# Patient Record
Sex: Male | Born: 1965
Health system: Southern US, Community
[De-identification: ages and names within clinical notes are randomized; demographics above are authoritative.]

## PROBLEM LIST (undated history)

## (undated) DIAGNOSIS — G4730 Sleep apnea, unspecified: Secondary | ICD-10-CM

## (undated) HISTORY — DX: Sleep apnea, unspecified: G47.30

## (undated) HISTORY — PX: HERNIA REPAIR: SHX51

## (undated) HISTORY — PX: TONSILECTOMY, ADENOIDECTOMY, BILATERAL MYRINGOTOMY AND TUBES: SHX2538

---

## 2009-07-20 ENCOUNTER — Ambulatory Visit: Payer: Self-pay | Admitting: Internal Medicine

## 2013-03-17 ENCOUNTER — Ambulatory Visit (INDEPENDENT_AMBULATORY_CARE_PROVIDER_SITE_OTHER): Payer: BC Managed Care – PPO | Admitting: Internal Medicine

## 2013-03-17 ENCOUNTER — Encounter: Payer: Self-pay | Admitting: Internal Medicine

## 2013-03-17 VITALS — BP 152/94 | HR 98 | Temp 98.4°F | Ht 72.0 in | Wt 219.0 lb

## 2013-03-17 DIAGNOSIS — I10 Essential (primary) hypertension: Secondary | ICD-10-CM | POA: Insufficient documentation

## 2013-03-17 DIAGNOSIS — R030 Elevated blood-pressure reading, without diagnosis of hypertension: Secondary | ICD-10-CM

## 2013-03-17 DIAGNOSIS — R0609 Other forms of dyspnea: Secondary | ICD-10-CM

## 2013-03-17 DIAGNOSIS — Z0000 Encounter for general adult medical examination without abnormal findings: Secondary | ICD-10-CM

## 2013-03-17 DIAGNOSIS — R0683 Snoring: Secondary | ICD-10-CM

## 2013-03-17 DIAGNOSIS — L301 Dyshidrosis [pompholyx]: Secondary | ICD-10-CM | POA: Insufficient documentation

## 2013-03-17 LAB — TSH: TSH: 0.94 u[IU]/mL (ref 0.35–5.50)

## 2013-03-17 LAB — COMPREHENSIVE METABOLIC PANEL
ALT: 46 U/L (ref 0–53)
AST: 32 U/L (ref 0–37)
Alkaline Phosphatase: 53 U/L (ref 39–117)
Calcium: 9.8 mg/dL (ref 8.4–10.5)
Chloride: 102 mEq/L (ref 96–112)
Creatinine, Ser: 1.1 mg/dL (ref 0.4–1.5)
Total Bilirubin: 0.8 mg/dL (ref 0.3–1.2)

## 2013-03-17 LAB — CBC WITH DIFFERENTIAL/PLATELET
Basophils Absolute: 0 10*3/uL (ref 0.0–0.1)
Eosinophils Relative: 1.7 % (ref 0.0–5.0)
Lymphocytes Relative: 24.3 % (ref 12.0–46.0)
Monocytes Relative: 11 % (ref 3.0–12.0)
Neutrophils Relative %: 62.6 % (ref 43.0–77.0)
Platelets: 227 10*3/uL (ref 150.0–400.0)
RDW: 12.4 % (ref 11.5–14.6)
WBC: 8.1 10*3/uL (ref 4.5–10.5)

## 2013-03-17 LAB — MICROALBUMIN / CREATININE URINE RATIO
Creatinine,U: 203.5 mg/dL
Microalb, Ur: 1.4 mg/dL (ref 0.0–1.9)

## 2013-03-17 LAB — LIPID PANEL
HDL: 42.6 mg/dL (ref >39.00)
Total CHOL/HDL Ratio: 5
VLDL: 33.4 mg/dL (ref 0.0–40.0)

## 2013-03-17 LAB — LDL CHOLESTEROL, DIRECT: Direct LDL: 151.7 mg/dL

## 2013-03-17 NOTE — Assessment & Plan Note (Signed)
BP Readings from Last 3 Encounters:  03/17/13 152/94   BP elevated today and has been elevated at home, per pt report. Will have pt monitor BP 1-2 times per week at home and email with readings. Follow up 4 weeks.

## 2013-03-17 NOTE — Patient Instructions (Signed)
Hand Dermatitis  Hand dermatitis (dyshidrotic eczema) is a skin condition in which small, itchy, raised dots or fluid-filled blisters form over the palms of the hands. Outbreaks of hand dermatitis can last 3 to 4 weeks.  CAUSES   The cause of hand dermatitis is unknown. However, it occurs most often in patients with a history of allergies such as:  · Hay fever.  · Allergic asthma.  · Allergies to latex.  Chemical exposure, injuries, and environmental irritants can make hand dermatitis worse. Washing your hands too frequently can remove natural oils, which can dry out the skin and contribute to outbreaks of hand dermatitis.  SYMPTOMS   The most common symptom of hand dermatitis is intense itching. Cracks or grooves (fissures) on the fingers can also develop. Affected areas can be painful, especially areas where large blisters have formed.  DIAGNOSIS  Your caregiver can usually tell what the problem is by doing a physical exam.  PREVENTION  · Avoid excessive hand washing.  · Avoid the use of harsh chemicals.  · Wear protective gloves when handling products that can irritate your skin.  TREATMENT   Steroid creams and ointments, such as over-the-counter 1% hydrocortisone cream, can reduce inflammation and improve moisture retention. These should be applied at least 2 to 4 times per day. Your caregiver may ask you to use a stronger prescription steroid cream to help speed the healing of blistered and cracked skin. In severe cases, oral steroid medicine may be needed. If you have an infection, antibiotics may be needed. Your caregiver may also prescribe antihistamines. These medicines help reduce itching.  HOME CARE INSTRUCTIONS  · Only take over-the-counter or prescription medicines as directed by your caregiver.  · You may use wet or cold compresses. This can help:  · Alleviate itching.  · Increase the effectiveness of topical creams.  · Minimize blisters.  SEEK MEDICAL CARE IF:  · The rash is not better after 1 week of  treatment.  · Signs of infection develop, such as redness, tenderness, or yellowish-white fluid (pus).  · The rash is spreading.  Document Released: 10/14/2005 Document Revised: 01/06/2012 Document Reviewed: 03/13/2011  [employer]® Patient Information ©2014 [employer].

## 2013-03-17 NOTE — Assessment & Plan Note (Signed)
Exam of dermatitis over hands most consistent with dyshidrotic eczema, however no improvement with avoidance of harsh soaps, use of emollient cream, and use of topical steroids (as prescribed by previous dermatologist). H/o psoriasis over elbows in the past. Question if this may be the cause of dermatitis over hands. Question if he may need immune modulating agents for better control of symptoms. Will set up dermatology evaluation.

## 2013-03-17 NOTE — Assessment & Plan Note (Signed)
Symptoms of snoring and witnessed apnea, concerning for obstructive sleep apnea. Will set up sleep study for evaluation.

## 2013-03-17 NOTE — Progress Notes (Signed)
  Subjective:    Patient ID: Henry Garrett, male    DOB: 23-Jun-1966, 47 y.o.   MRN: [id number]  HPI 47YO male presents to establish care. Notes that his wife has been concerned that he is snoring at night and his breathing pauses on occasion. No known h/o sleep apnea. Occasional daytime fatigue.  Also concerned about several year h/o rash over his hands. Previous physician treated with topical steroids with minimal improvement. Had discussed using biologic agents, but never started. Rash described as painful, red. Worsening with frequent hand washing. No improvement with gentle soaps, moisturizing creams, topical steroids.  Also notes that BP has been elevated over last several months, typically 120-140s/80s. No chest pain, headache, palpitations. No use of supplement meds or excessive caffeine intake.  No outpatient encounter prescriptions on file as of 03/17/2013.   No facility-administered encounter medications on file as of 03/17/2013.   BP 152/94  Pulse 98  Temp(Src) 98.4 F (36.9 C) (Oral)  Ht 6' (1.829 m)  Wt 219 lb (99.338 kg)  BMI 29.7 kg/m2  SpO2 98%  Review of Systems  Constitutional: Negative for fever, chills, activity change, appetite change, fatigue and unexpected weight change.  Eyes: Negative for visual disturbance.  Respiratory: Negative for cough and shortness of breath.   Cardiovascular: Negative for chest pain, palpitations and leg swelling.  Gastrointestinal: Negative for abdominal pain and abdominal distention.  Genitourinary: Negative for dysuria, urgency and difficulty urinating.  Musculoskeletal: Negative for arthralgias and gait problem.  Skin: Positive for rash. Negative for color change.  Hematological: Negative for adenopathy.  Psychiatric/Behavioral: Negative for sleep disturbance and dysphoric mood. The patient is not nervous/anxious.        Objective:   Physical Exam  Constitutional: He is oriented to person, place, and time. He appears  well-developed and well-nourished. No distress.  HENT:  Head: Normocephalic and atraumatic.  Right Ear: External ear normal.  Left Ear: External ear normal.  Nose: Nose normal.  Mouth/Throat: Oropharynx is clear and moist. No oropharyngeal exudate.  Eyes: Conjunctivae and EOM are normal. Pupils are equal, round, and reactive to light. Right eye exhibits no discharge. Left eye exhibits no discharge. No scleral icterus.  Neck: Normal range of motion. Neck supple. No tracheal deviation present. No thyromegaly present.  Cardiovascular: Normal rate, regular rhythm and normal heart sounds.  Exam reveals no gallop and no friction rub.   No murmur heard. Pulmonary/Chest: Effort normal and breath sounds normal. No respiratory distress. He has no wheezes. He has no rales. He exhibits no tenderness.  Abdominal: Soft. Bowel sounds are normal. He exhibits no distension. There is no tenderness.  Musculoskeletal: Normal range of motion. He exhibits no edema.  Lymphadenopathy:    He has no cervical adenopathy.  Neurological: He is alert and oriented to person, place, and time. No cranial nerve deficit. Coordination normal.  Skin: Skin is warm and dry. Rash noted. Rash is maculopapular (scaling rash over hands bilaterally). He is not diaphoretic. There is erythema. No pallor.  Psychiatric: He has a normal mood and affect. His behavior is normal. Judgment and thought content normal.          Assessment & Plan:

## 2013-03-18 ENCOUNTER — Encounter: Payer: Self-pay | Admitting: *Deleted

## 2013-03-18 ENCOUNTER — Telehealth: Payer: Self-pay | Admitting: *Deleted

## 2013-03-18 NOTE — Telephone Encounter (Signed)
Form completed for [facility] and faxed to [phone]. Original copy mailed to patient home address on file.

## 2013-04-12 ENCOUNTER — Telehealth: Payer: Self-pay | Admitting: Internal Medicine

## 2013-04-12 NOTE — Telephone Encounter (Signed)
Sleep Study showed severe obstructive sleep apnea. We need to set up CPAP titration for pt. Can you let pt know?  Amber, Can you help set up CPAP titration?

## 2013-04-13 NOTE — Telephone Encounter (Signed)
Spoke with patient, he stated this has already been done. They suppose to be setting him up with equipment sometime this afternoon

## 2013-04-13 NOTE — Telephone Encounter (Signed)
Left message to call back  

## 2013-04-16 ENCOUNTER — Encounter: Payer: Self-pay | Admitting: Internal Medicine

## 2013-04-22 ENCOUNTER — Ambulatory Visit: Payer: BC Managed Care – PPO | Admitting: Internal Medicine

## 2013-04-23 ENCOUNTER — Encounter: Payer: Self-pay | Admitting: Internal Medicine

## 2013-05-13 ENCOUNTER — Encounter: Payer: Self-pay | Admitting: Internal Medicine

## 2013-05-28 ENCOUNTER — Ambulatory Visit (INDEPENDENT_AMBULATORY_CARE_PROVIDER_SITE_OTHER): Payer: BC Managed Care – PPO | Admitting: Internal Medicine

## 2013-05-28 ENCOUNTER — Encounter: Payer: Self-pay | Admitting: Internal Medicine

## 2013-05-28 VITALS — BP 148/84 | HR 90 | Temp 98.2°F | Wt 225.0 lb

## 2013-05-28 DIAGNOSIS — I10 Essential (primary) hypertension: Secondary | ICD-10-CM

## 2013-05-28 DIAGNOSIS — E669 Obesity, unspecified: Secondary | ICD-10-CM | POA: Insufficient documentation

## 2013-05-28 DIAGNOSIS — G4733 Obstructive sleep apnea (adult) (pediatric): Secondary | ICD-10-CM | POA: Insufficient documentation

## 2013-05-28 NOTE — Patient Instructions (Signed)
Please check blood pressure 1-2 times per week and email with readings.

## 2013-05-28 NOTE — Assessment & Plan Note (Signed)
BP Readings from Last 3 Encounters:  05/28/13 148/84  03/17/13 152/94   BP elevated today however well controlled at home. Will have pt monitor BP closely at home and email with readings. If consistently >140/90, then will start medication. Recent renal function and urine microalbumin was normal.

## 2013-05-28 NOTE — Assessment & Plan Note (Signed)
Sleeping better with use of CPAP. Will continue. Follow up prn.

## 2013-05-28 NOTE — Progress Notes (Signed)
  Subjective:    Patient ID: Henry Garrett, male    DOB: [birth year], 47 y.o.   MRN: [id number]  HPI 47YO male with recent diagnosis of OSA presents for follow up. Sleep study 03/2013 showed OSA. Started on CPAP auto-titrate 12-20cmH2O. Tolerating well. Feels more rested. Less waking at night.   In regards to BP, has been checking at home, typically 130s/80s. No chest pain, palpitations, dyspnea. Has been trying to improve diet and increase physical activity in effort to control weight and BP.  No outpatient encounter prescriptions on file as of 05/28/2013.   No facility-administered encounter medications on file as of 05/28/2013.   BP 148/84  Pulse 90  Temp(Src) 98.2 F (36.8 C) (Oral)  Wt 225 lb (102.059 kg)  BMI 30.51 kg/m2  SpO2 97%  Review of Systems  Constitutional: Negative for fever, chills, activity change, appetite change, fatigue and unexpected weight change.  Eyes: Negative for visual disturbance.  Respiratory: Negative for cough and shortness of breath.   Cardiovascular: Negative for chest pain, palpitations and leg swelling.  Gastrointestinal: Negative for abdominal pain and abdominal distention.  Genitourinary: Negative for dysuria, urgency and difficulty urinating.  Musculoskeletal: Negative for arthralgias and gait problem.  Skin: Negative for color change and rash.  Hematological: Negative for adenopathy.  Psychiatric/Behavioral: Negative for sleep disturbance and dysphoric mood. The patient is not nervous/anxious.        Objective:   Physical Exam  Constitutional: He is oriented to person, place, and time. He appears well-developed and well-nourished. No distress.  HENT:  Head: Normocephalic and atraumatic.  Right Ear: External ear normal.  Left Ear: External ear normal.  Nose: Nose normal.  Mouth/Throat: Oropharynx is clear and moist. No oropharyngeal exudate.  Eyes: Conjunctivae and EOM are normal. Pupils are equal, round, and reactive to light. Right eye  exhibits no discharge. Left eye exhibits no discharge. No scleral icterus.  Neck: Normal range of motion. Neck supple. No tracheal deviation present. No thyromegaly present.  Cardiovascular: Normal rate, regular rhythm and normal heart sounds.  Exam reveals no gallop and no friction rub.   No murmur heard. Pulmonary/Chest: Effort normal and breath sounds normal. No respiratory distress. He has no wheezes. He has no rales. He exhibits no tenderness.  Musculoskeletal: Normal range of motion. He exhibits no edema.  Lymphadenopathy:    He has no cervical adenopathy.  Neurological: He is alert and oriented to person, place, and time. No cranial nerve deficit. Coordination normal.  Skin: Skin is warm and dry. No rash noted. He is not diaphoretic. No erythema. No pallor.  Psychiatric: He has a normal mood and affect. His behavior is normal. Judgment and thought content normal.          Assessment & Plan:

## 2013-05-28 NOTE — Assessment & Plan Note (Signed)
Wt Readings from Last 3 Encounters:  05/28/13 225 lb (102.059 kg)  03/17/13 219 lb (99.338 kg)   Body mass index is 30.51 kg/(m^2). Encouraged healthy diet, low in sodium and processed carbohydrates with caloric restriction and goal of weight loss. Encouraged regular physical activity.

## 2013-05-31 ENCOUNTER — Encounter: Payer: Self-pay | Admitting: Internal Medicine

## 2013-06-14 ENCOUNTER — Encounter: Payer: Self-pay | Admitting: Internal Medicine

## 2013-06-17 ENCOUNTER — Encounter: Payer: Self-pay | Admitting: Internal Medicine

## 2013-06-25 ENCOUNTER — Ambulatory Visit: Payer: BC Managed Care – PPO | Admitting: Internal Medicine

## 2013-09-02 ENCOUNTER — Other Ambulatory Visit: Payer: Self-pay

## 2013-09-02 ENCOUNTER — Ambulatory Visit: Payer: BC Managed Care – PPO | Admitting: Internal Medicine

## 2013-10-01 ENCOUNTER — Ambulatory Visit: Payer: BC Managed Care – PPO | Admitting: Internal Medicine

## 2013-11-02 ENCOUNTER — Ambulatory Visit (INDEPENDENT_AMBULATORY_CARE_PROVIDER_SITE_OTHER): Payer: BC Managed Care – PPO | Admitting: Internal Medicine

## 2013-11-02 ENCOUNTER — Encounter: Payer: Self-pay | Admitting: Internal Medicine

## 2013-11-02 VITALS — BP 158/90 | HR 104 | Temp 98.1°F | Wt 226.0 lb

## 2013-11-02 DIAGNOSIS — I10 Essential (primary) hypertension: Secondary | ICD-10-CM

## 2013-11-02 NOTE — Assessment & Plan Note (Signed)
BP Readings from Last 3 Encounters:  11/02/13 158/90  05/28/13 148/84  03/17/13 152/94   BP elevated again today. Recommended starting medication to help control BP. Pt would likely benefit from a betablocker, given anxiety, tachycardia. Pt declines for now. Would like to work on diet and exercise and follow up in 4 weeks to see if BP continues to be elevated. Encouraged him to buy a BP cuff for home and email periodically with readings.

## 2013-11-02 NOTE — Progress Notes (Signed)
   Subjective:    Patient ID: Henry Garrett, male    DOB: 01-Jul-1966, 48 y.o.   MRN: [id number]  HPI 48 year old male with history of hypertension presents for followup. He has not been checking his blood pressure at home. He denies any chest pain, palpitations, headache. He notes some dietary indiscretion and weight gain over the holidays.  No outpatient encounter prescriptions on file as of 11/02/2013.   BP 158/90  Pulse 104  Temp(Src) 98.1 F (36.7 C) (Oral)  Wt 226 lb (102.513 kg)  SpO2 98%  Review of Systems  Constitutional: Negative for fever, chills, activity change, appetite change, fatigue and unexpected weight change.  Eyes: Negative for visual disturbance.  Respiratory: Negative for cough and shortness of breath.   Cardiovascular: Negative for chest pain, palpitations and leg swelling.  Gastrointestinal: Negative for abdominal pain and abdominal distention.  Genitourinary: Negative for dysuria, urgency and difficulty urinating.  Musculoskeletal: Negative for arthralgias and gait problem.  Skin: Negative for color change and rash.  Hematological: Negative for adenopathy.  Psychiatric/Behavioral: Negative for sleep disturbance and dysphoric mood. The patient is not nervous/anxious.        Objective:   Physical Exam  Constitutional: He is oriented to person, place, and time. He appears well-developed and well-nourished. No distress.  HENT:  Head: Normocephalic and atraumatic.  Right Ear: External ear normal.  Left Ear: External ear normal.  Nose: Nose normal.  Mouth/Throat: Oropharynx is clear and moist. No oropharyngeal exudate.  Eyes: Conjunctivae and EOM are normal. Pupils are equal, round, and reactive to light. Right eye exhibits no discharge. Left eye exhibits no discharge. No scleral icterus.  Neck: Normal range of motion. Neck supple. No tracheal deviation present. No thyromegaly present.  Cardiovascular: Normal rate, regular rhythm and normal heart sounds.   Exam reveals no gallop and no friction rub.   No murmur heard. Pulmonary/Chest: Effort normal and breath sounds normal. No respiratory distress. He has no wheezes. He has no rales. He exhibits no tenderness.  Musculoskeletal: Normal range of motion. He exhibits no edema.  Lymphadenopathy:    He has no cervical adenopathy.  Neurological: He is alert and oriented to person, place, and time. No cranial nerve deficit. Coordination normal.  Skin: Skin is warm and dry. No rash noted. He is not diaphoretic. No erythema. No pallor.  Psychiatric: His behavior is normal. Judgment and thought content normal. His mood appears anxious.          Assessment & Plan:

## 2013-11-02 NOTE — Progress Notes (Signed)
Pre-visit discussion using our clinic review tool. No additional management support is needed unless otherwise documented below in the visit note.  

## 2013-11-30 ENCOUNTER — Telehealth: Payer: Self-pay | Admitting: Internal Medicine

## 2013-11-30 NOTE — Telephone Encounter (Signed)
Relevant patient education assigned to patient using Emmi. ° °

## 2014-01-10 ENCOUNTER — Ambulatory Visit (INDEPENDENT_AMBULATORY_CARE_PROVIDER_SITE_OTHER): Payer: BC Managed Care – PPO | Admitting: Internal Medicine

## 2014-01-10 ENCOUNTER — Encounter: Payer: Self-pay | Admitting: Internal Medicine

## 2014-01-10 VITALS — BP 148/88 | HR 96 | Temp 98.1°F | Resp 16 | Wt 225.0 lb

## 2014-01-10 DIAGNOSIS — I10 Essential (primary) hypertension: Secondary | ICD-10-CM

## 2014-01-10 MED ORDER — METOPROLOL SUCCINATE ER 25 MG PO TB24: 25.0000 mg | ORAL_TABLET | Freq: Every day | ORAL | Status: DC

## 2014-01-10 NOTE — Assessment & Plan Note (Signed)
BP Readings from Last 3 Encounters:  01/10/14 148/88  11/02/13 158/90  05/28/13 148/84   BP persistently elevated. Will start metoprolol 25mg  daily. Pt will monitor BP at home and follow up with readings. Follow up visit in 02/2014.

## 2014-01-10 NOTE — Progress Notes (Signed)
Pre visit review using our clinic review tool, if applicable. No additional management support is needed unless otherwise documented below in the visit note. 

## 2014-01-10 NOTE — Progress Notes (Signed)
   Subjective:    Patient ID: Henry Garrett, male    DOB: 1966-10-13, 48 y.o.   MRN: [id number]  HPI 48YO male with HTN presents for follow up.  HTN - BP has been up and down. Highest readings in 160s/100s. No chest pain, headache.  Review of Systems  Constitutional: Negative for fever, chills, activity change, appetite change, fatigue and unexpected weight change.  Eyes: Negative for visual disturbance.  Respiratory: Negative for cough and shortness of breath.   Cardiovascular: Negative for chest pain, palpitations and leg swelling.  Gastrointestinal: Negative for abdominal pain and abdominal distention.  Genitourinary: Negative for dysuria, urgency and difficulty urinating.  Musculoskeletal: Negative for arthralgias and gait problem.  Skin: Negative for color change and rash.  Hematological: Negative for adenopathy.  Psychiatric/Behavioral: Negative for sleep disturbance and dysphoric mood. The patient is not nervous/anxious.        Objective:    BP 148/88  Pulse 96  Temp(Src) 98.1 F (36.7 C) (Oral)  Resp 16  Wt 225 lb (102.059 kg)  SpO2 96% Physical Exam  Constitutional: He is oriented to person, place, and time. He appears well-developed and well-nourished. No distress.  HENT:  Head: Normocephalic and atraumatic.  Right Ear: External ear normal.  Left Ear: External ear normal.  Nose: Nose normal.  Mouth/Throat: Oropharynx is clear and moist. No oropharyngeal exudate.  Eyes: Conjunctivae and EOM are normal. Pupils are equal, round, and reactive to light. Right eye exhibits no discharge. Left eye exhibits no discharge. No scleral icterus.  Neck: Normal range of motion. Neck supple. No tracheal deviation present. No thyromegaly present.  Cardiovascular: Normal rate, regular rhythm and normal heart sounds.  Exam reveals no gallop and no friction rub.   No murmur heard. Pulmonary/Chest: Effort normal and breath sounds normal. No accessory muscle usage. Not tachypneic.  No respiratory distress. He has no decreased breath sounds. He has no wheezes. He has no rhonchi. He has no rales. He exhibits no tenderness.  Musculoskeletal: Normal range of motion. He exhibits no edema.  Lymphadenopathy:    He has no cervical adenopathy.  Neurological: He is alert and oriented to person, place, and time. No cranial nerve deficit. Coordination normal.  Skin: Skin is warm and dry. No rash noted. He is not diaphoretic. No erythema. No pallor.  Psychiatric: His behavior is normal. Judgment and thought content normal. His mood appears anxious.          Assessment & Plan:   Problem List Items Addressed This Visit   Essential hypertension, benign - Primary      BP Readings from Last 3 Encounters:  01/10/14 148/88  11/02/13 158/90  05/28/13 148/84   BP persistently elevated. Will start metoprolol 25mg  daily. Pt will monitor BP at home and follow up with readings. Follow up visit in 02/2014.    Relevant Medications      metoprolol succinate (TOPROL-XL) 24 hr tablet       Return in about 10 weeks (around 03/21/2014) for Physical.

## 2014-01-11 ENCOUNTER — Telehealth: Payer: Self-pay | Admitting: Internal Medicine

## 2014-01-11 NOTE — Telephone Encounter (Signed)
Relevant patient education assigned to patient using Emmi. ° °

## 2014-01-21 ENCOUNTER — Encounter: Payer: Self-pay | Admitting: Internal Medicine

## 2014-01-27 ENCOUNTER — Other Ambulatory Visit: Payer: Self-pay | Admitting: *Deleted

## 2014-01-27 DIAGNOSIS — I10 Essential (primary) hypertension: Secondary | ICD-10-CM

## 2014-01-27 MED ORDER — METOPROLOL SUCCINATE ER 50 MG PO TB24: 50.0000 mg | ORAL_TABLET | Freq: Every day | ORAL | Status: DC

## 2014-01-27 NOTE — Telephone Encounter (Signed)
Prescription sent to the pharmacy.

## 2014-01-27 NOTE — Telephone Encounter (Signed)
Should just be Metoprolol XL 50mg  daily #30 with 6 refills.

## 2014-01-27 NOTE — Telephone Encounter (Signed)
Patient called and stated he needs a refill on Metoprolol. It was prescribed as once a day then he was instructed to increase it to twice a day. Pharmacy will not refill because it is too soon, need new prescription sent to pharmacy if this is correct.

## 2014-03-15 ENCOUNTER — Encounter: Payer: BC Managed Care – PPO | Admitting: Internal Medicine

## 2014-03-23 ENCOUNTER — Other Ambulatory Visit: Payer: Self-pay | Admitting: *Deleted

## 2014-03-23 MED ORDER — METOPROLOL SUCCINATE ER 50 MG PO TB24: 50.0000 mg | ORAL_TABLET | Freq: Every day | ORAL | Status: DC

## 2014-03-23 NOTE — Telephone Encounter (Signed)
Pt states that Rx must be sent to [facility]

## 2014-04-18 ENCOUNTER — Encounter: Payer: BC Managed Care – PPO | Admitting: Internal Medicine

## 2014-05-03 ENCOUNTER — Ambulatory Visit (INDEPENDENT_AMBULATORY_CARE_PROVIDER_SITE_OTHER): Payer: BC Managed Care – PPO | Admitting: Internal Medicine

## 2014-05-03 ENCOUNTER — Encounter: Payer: Self-pay | Admitting: Internal Medicine

## 2014-05-03 VITALS — BP 140/86 | HR 93 | Temp 98.1°F | Ht 72.5 in | Wt 232.8 lb

## 2014-05-03 DIAGNOSIS — Z0000 Encounter for general adult medical examination without abnormal findings: Secondary | ICD-10-CM

## 2014-05-03 DIAGNOSIS — I10 Essential (primary) hypertension: Secondary | ICD-10-CM

## 2014-05-03 DIAGNOSIS — E669 Obesity, unspecified: Secondary | ICD-10-CM

## 2014-05-03 NOTE — Assessment & Plan Note (Signed)
General medical exam normal today except as noted. Will check labs including CBC, CMP, lipids. Encouraged healthy diet and exercise. Immunizations are UTD except flu which he will get this fall. Follow up 6 months and prn.

## 2014-05-03 NOTE — Progress Notes (Signed)
Pre visit review using our clinic review tool, if applicable. No additional management support is needed unless otherwise documented below in the visit note. 

## 2014-05-03 NOTE — Patient Instructions (Signed)
It was nice to see you today.  You are doing well.  Continue healthy diet and set a goal of exercising 40 minutes three times per week.

## 2014-05-03 NOTE — Progress Notes (Signed)
Subjective:    Patient ID: Henry Garrett, male    DOB: 03/26/1966, 48 y.o.   MRN: [id number]  HPI 48YO male presents for annual exam. Doing well. BP typically 120s/70s with HR near 60s. Compliant with meds. Notes that his hand tremor has improved with use of Metoprolol. Writing has improved.  Not recently exercising. Not following any specific diet.   Review of Systems  Constitutional: Negative for fever, chills, activity change, appetite change, fatigue and unexpected weight change.  Eyes: Negative for visual disturbance.  Respiratory: Negative for cough and shortness of breath.   Cardiovascular: Negative for chest pain, palpitations and leg swelling.  Gastrointestinal: Negative for nausea, vomiting, abdominal pain, diarrhea, constipation and abdominal distention.  Genitourinary: Negative for dysuria, urgency, frequency and difficulty urinating.  Musculoskeletal: Negative for arthralgias and gait problem.  Skin: Negative for color change and rash.  Hematological: Negative for adenopathy.  Psychiatric/Behavioral: Negative for sleep disturbance and dysphoric mood. The patient is not nervous/anxious.        Objective:    BP 140/86  Pulse 93  Temp(Src) 98.1 F (36.7 C) (Oral)  Ht 6' 0.5" (1.842 m)  Wt 232 lb 12 oz (105.575 kg)  BMI 31.12 kg/m2  SpO2 98%  Waist circum 41in. Physical Exam  Constitutional: He is oriented to person, place, and time. He appears well-developed and well-nourished. No distress.  HENT:  Head: Normocephalic and atraumatic.  Right Ear: External ear normal.  Left Ear: External ear normal.  Nose: Nose normal.  Mouth/Throat: Oropharynx is clear and moist. No oropharyngeal exudate.  Eyes: Conjunctivae and EOM are normal. Pupils are equal, round, and reactive to light. Right eye exhibits no discharge. Left eye exhibits no discharge. No scleral icterus.  Neck: Normal range of motion. Neck supple. No tracheal deviation present. No thyromegaly present.   Cardiovascular: Normal rate, regular rhythm and normal heart sounds.  Exam reveals no gallop and no friction rub.   No murmur heard. Pulmonary/Chest: Effort normal and breath sounds normal. No respiratory distress. He has no wheezes. He has no rales. He exhibits no tenderness.  Abdominal: Soft. Bowel sounds are normal. He exhibits no distension and no mass. There is no tenderness. There is no rebound and no guarding.  Musculoskeletal: Normal range of motion. He exhibits no edema.  Lymphadenopathy:    He has no cervical adenopathy.  Neurological: He is alert and oriented to person, place, and time. He displays tremor (bilateral fine hand tremor). No cranial nerve deficit. Coordination and gait normal.  Skin: Skin is warm and dry. No rash noted. He is not diaphoretic. No erythema. No pallor.  Psychiatric: His behavior is normal. Judgment and thought content normal. His mood appears anxious.          Assessment & Plan:   Problem List Items Addressed This Visit     Unprioritized   Essential hypertension, benign      BP Readings from Last 3 Encounters:  05/03/14 140/86  01/10/14 148/88  11/02/13 158/90   BP improved on Metoprolol. Will check renal function with labs.    Obesity (BMI 30-39.9)      Wt Readings from Last 3 Encounters:  05/03/14 232 lb 12 oz (105.575 kg)  01/10/14 225 lb (102.059 kg)  11/02/13 226 lb (102.513 kg)   Body mass index is 31.12 kg/(m^2). Encouraged healthy, Mediterranean style diet and regular exercise with goal of 7min 3x per week.    Routine general medical examination at a health care facility - Primary  General medical exam normal today except as noted. Will check labs including CBC, CMP, lipids. Encouraged healthy diet and exercise. Immunizations are UTD except flu which he will get this fall. Follow up 6 months and prn.    Relevant Orders      CBC with Differential      Comprehensive metabolic panel      Lipid panel      Microalbumin /  creatinine urine ratio      Vit D  25 hydroxy (rtn osteoporosis monitoring)       Return in about 6 months (around 11/03/2014) for Recheck of Blood Pressure.

## 2014-05-03 NOTE — Assessment & Plan Note (Signed)
BP Readings from Last 3 Encounters:  05/03/14 140/86  01/10/14 148/88  11/02/13 158/90   BP improved on Metoprolol. Will check renal function with labs.

## 2014-05-03 NOTE — Assessment & Plan Note (Signed)
Wt Readings from Last 3 Encounters:  05/03/14 232 lb 12 oz (105.575 kg)  01/10/14 225 lb (102.059 kg)  11/02/13 226 lb (102.513 kg)   Body mass index is 31.12 kg/(m^2). Encouraged healthy, Mediterranean style diet and regular exercise with goal of 22min 3x per week.

## 2014-05-04 LAB — COMPREHENSIVE METABOLIC PANEL
ALBUMIN: 4.4 g/dL (ref 3.5–5.2)
ALK PHOS: 52 U/L (ref 39–117)
ALT: 44 U/L (ref 0–53)
AST: 39 U/L — ABNORMAL HIGH (ref 0–37)
BUN: 11 mg/dL (ref 6–23)
CO2: 28 mEq/L (ref 19–32)
Calcium: 9.3 mg/dL (ref 8.4–10.5)
Chloride: 101 mEq/L (ref 96–112)
Creatinine, Ser: 1 mg/dL (ref 0.4–1.5)
GFR: 82.42 mL/min (ref >60.00)
Glucose, Bld: 99 mg/dL (ref 70–99)
Potassium: 4.1 mEq/L (ref 3.5–5.1)
Sodium: 137 mEq/L (ref 135–145)
Total Bilirubin: 0.8 mg/dL (ref 0.2–1.2)
Total Protein: 7.6 g/dL (ref 6.0–8.3)

## 2014-05-04 LAB — CBC WITH DIFFERENTIAL/PLATELET
BASOS PCT: 0.4 % (ref 0.0–3.0)
Basophils Absolute: 0 10*3/uL (ref 0.0–0.1)
EOS ABS: 0.2 10*3/uL (ref 0.0–0.7)
Eosinophils Relative: 1.9 % (ref 0.0–5.0)
HCT: 46.6 % (ref 39.0–52.0)
Hemoglobin: 15.7 g/dL (ref 13.0–17.0)
LYMPHS PCT: 22.7 % (ref 12.0–46.0)
Lymphs Abs: 1.9 10*3/uL (ref 0.7–4.0)
MCHC: 33.7 g/dL (ref 30.0–36.0)
MCV: 93.1 fl (ref 78.0–100.0)
MONO ABS: 0.8 10*3/uL (ref 0.1–1.0)
Monocytes Relative: 9.8 % (ref 3.0–12.0)
Neutro Abs: 5.6 10*3/uL (ref 1.4–7.7)
Neutrophils Relative %: 65.2 % (ref 43.0–77.0)
PLATELETS: 164 10*3/uL (ref 150.0–400.0)
RBC: 5.01 Mil/uL (ref 4.22–5.81)
RDW: 12.9 % (ref 11.5–15.5)
WBC: 8.5 10*3/uL (ref 4.0–10.5)

## 2014-05-04 LAB — VITAMIN D 25 HYDROXY (VIT D DEFICIENCY, FRACTURES): VITD: 42.18 ng/mL

## 2014-05-04 LAB — MICROALBUMIN / CREATININE URINE RATIO
Creatinine,U: 165.2 mg/dL
Microalb Creat Ratio: 0.2 mg/g (ref 0.0–30.0)
Microalb, Ur: 0.3 mg/dL (ref 0.0–1.9)

## 2014-05-04 LAB — LIPID PANEL
Cholesterol: 244 mg/dL — ABNORMAL HIGH (ref 0–200)
HDL: 49 mg/dL (ref >39.00)
LDL CALC: 171 mg/dL — AB (ref 0–99)
NONHDL: 195
TRIGLYCERIDES: 121 mg/dL (ref 0.0–149.0)
Total CHOL/HDL Ratio: 5
VLDL: 24.2 mg/dL (ref 0.0–40.0)

## 2014-05-17 ENCOUNTER — Encounter: Payer: Self-pay | Admitting: Internal Medicine

## 2014-11-11 ENCOUNTER — Encounter: Payer: Self-pay | Admitting: Internal Medicine

## 2014-11-11 ENCOUNTER — Ambulatory Visit (INDEPENDENT_AMBULATORY_CARE_PROVIDER_SITE_OTHER): Payer: BLUE CROSS/BLUE SHIELD | Admitting: Internal Medicine

## 2014-11-11 VITALS — BP 152/81 | HR 93 | Temp 97.7°F | Ht 72.5 in | Wt 242.8 lb

## 2014-11-11 DIAGNOSIS — R200 Anesthesia of skin: Secondary | ICD-10-CM

## 2014-11-11 DIAGNOSIS — R635 Abnormal weight gain: Secondary | ICD-10-CM

## 2014-11-11 DIAGNOSIS — Z23 Encounter for immunization: Secondary | ICD-10-CM

## 2014-11-11 DIAGNOSIS — I10 Essential (primary) hypertension: Secondary | ICD-10-CM

## 2014-11-11 DIAGNOSIS — R6889 Other general symptoms and signs: Secondary | ICD-10-CM | POA: Insufficient documentation

## 2014-11-11 DIAGNOSIS — R208 Other disturbances of skin sensation: Secondary | ICD-10-CM

## 2014-11-11 LAB — HEMOGLOBIN A1C: HEMOGLOBIN A1C: 6 % (ref 4.6–6.5)

## 2014-11-11 LAB — COMPREHENSIVE METABOLIC PANEL
ALT: 46 U/L (ref 0–53)
AST: 30 U/L (ref 0–37)
Albumin: 4.2 g/dL (ref 3.5–5.2)
Alkaline Phosphatase: 63 U/L (ref 39–117)
BUN: 11 mg/dL (ref 6–23)
CALCIUM: 9.6 mg/dL (ref 8.4–10.5)
CO2: 25 mEq/L (ref 19–32)
CREATININE: 1.03 mg/dL (ref 0.40–1.50)
Chloride: 104 mEq/L (ref 96–112)
GFR: 82.24 mL/min (ref >60.00)
GLUCOSE: 125 mg/dL — AB (ref 70–99)
POTASSIUM: 4.9 meq/L (ref 3.5–5.1)
Sodium: 138 mEq/L (ref 135–145)
TOTAL PROTEIN: 7.5 g/dL (ref 6.0–8.3)
Total Bilirubin: 0.4 mg/dL (ref 0.2–1.2)

## 2014-11-11 LAB — CBC WITH DIFFERENTIAL/PLATELET
BASOS PCT: 0.5 % (ref 0.0–3.0)
Basophils Absolute: 0 10*3/uL (ref 0.0–0.1)
EOS PCT: 3.5 % (ref 0.0–5.0)
Eosinophils Absolute: 0.2 10*3/uL (ref 0.0–0.7)
HEMATOCRIT: 45.9 % (ref 39.0–52.0)
HEMOGLOBIN: 15.5 g/dL (ref 13.0–17.0)
Lymphocytes Relative: 26.6 % (ref 12.0–46.0)
Lymphs Abs: 1.8 10*3/uL (ref 0.7–4.0)
MCHC: 33.9 g/dL (ref 30.0–36.0)
MCV: 92.5 fl (ref 78.0–100.0)
MONO ABS: 0.8 10*3/uL (ref 0.1–1.0)
Monocytes Relative: 11.8 % (ref 3.0–12.0)
NEUTROS PCT: 57.6 % (ref 43.0–77.0)
Neutro Abs: 4 10*3/uL (ref 1.4–7.7)
Platelets: 207 10*3/uL (ref 150.0–400.0)
RBC: 4.96 Mil/uL (ref 4.22–5.81)
RDW: 12.6 % (ref 11.5–15.5)
WBC: 6.9 10*3/uL (ref 4.0–10.5)

## 2014-11-11 LAB — TSH: TSH: 1.31 u[IU]/mL (ref 0.35–4.50)

## 2014-11-11 MED ORDER — CARVEDILOL 6.25 MG PO TABS: 6.2500 mg | ORAL_TABLET | Freq: Two times a day (BID) | ORAL | Status: DC

## 2014-11-11 NOTE — Assessment & Plan Note (Signed)
Wt Readings from Last 3 Encounters:  11/11/14 242 lb 12 oz (110.111 kg)  05/03/14 232 lb 12 oz (105.575 kg)  01/10/14 225 lb (102.059 kg)   Body mass index is 32.45 kg/(m^2). Encouraged healthy diet and exercise. Will check thyroid function with labs.

## 2014-11-11 NOTE — Progress Notes (Signed)
Pre visit review using our clinic review tool, if applicable. No additional management support is needed unless otherwise documented below in the visit note. 

## 2014-11-11 NOTE — Assessment & Plan Note (Signed)
BP Readings from Last 3 Encounters:  11/11/14 152/81  05/03/14 140/86  01/10/14 148/88   BP elevated today. Pt not feeling well on Metoprolol. Will try change to Carvedilol. Email follow up in 2 weeks and visit in 4 weeks.

## 2014-11-11 NOTE — Assessment & Plan Note (Signed)
Temp intolerance and weight gain. Will check thyroid function with labs.

## 2014-11-11 NOTE — Progress Notes (Signed)
Subjective:    Patient ID: Henry Garrett, male    DOB: 07-Sep-1966, 49 y.o.   MRN: [id number]  HPI 49YO male presents for follow up.  HTN - Compliant with Metoprolol. BP typically 140/80s. HR in 60s. Concerned about weight gain on the medication.  Notices more tremor recently in bilateral hands. Notes some intolerance to hot and cold temps. No constipation.  Also concerned about numbness left lateral thigh. No weakness noted. No back pain.Worse when driving long distance. Improves with movement and stretching. Not taking anything for this.  Wt Readings from Last 3 Encounters:  11/11/14 242 lb 12 oz (110.111 kg)  05/03/14 232 lb 12 oz (105.575 kg)  01/10/14 225 lb (102.059 kg)     Past medical, surgical, family and social history per today's encounter.  Review of Systems  Constitutional: Negative for fever, chills, activity change, appetite change, fatigue and unexpected weight change.  Eyes: Negative for visual disturbance.  Respiratory: Negative for cough and shortness of breath.   Cardiovascular: Negative for chest pain, palpitations and leg swelling.  Gastrointestinal: Negative for nausea, vomiting, abdominal pain, diarrhea, constipation and abdominal distention.  Endocrine: Positive for heat intolerance.  Genitourinary: Negative for dysuria, urgency and difficulty urinating.  Musculoskeletal: Positive for myalgias. Negative for arthralgias and gait problem.  Skin: Negative for color change and rash.  Neurological: Positive for tremors and numbness. Negative for weakness.  Hematological: Negative for adenopathy.  Psychiatric/Behavioral: Negative for sleep disturbance and dysphoric mood. The patient is not nervous/anxious.        Objective:    BP 152/81 mmHg  Pulse 93  Temp(Src) 97.7 F (36.5 C) (Oral)  Ht 6' 0.5" (1.842 m)  Wt 242 lb 12 oz (110.111 kg)  BMI 32.45 kg/m2  SpO2 99% Physical Exam  Constitutional: He is oriented to person, place, and time. He  appears well-developed and well-nourished. No distress.  HENT:  Head: Normocephalic and atraumatic.  Right Ear: External ear normal.  Left Ear: External ear normal.  Nose: Nose normal.  Mouth/Throat: Oropharynx is clear and moist. No oropharyngeal exudate.  Eyes: Conjunctivae and EOM are normal. Pupils are equal, round, and reactive to light. Right eye exhibits no discharge. Left eye exhibits no discharge. No scleral icterus.  Neck: Normal range of motion. Neck supple. No tracheal deviation present. No thyromegaly present.  Cardiovascular: Normal rate, regular rhythm and normal heart sounds.  Exam reveals no gallop and no friction rub.   No murmur heard. Pulmonary/Chest: Effort normal and breath sounds normal. No accessory muscle usage. No tachypnea. No respiratory distress. He has no decreased breath sounds. He has no wheezes. He has no rhonchi. He has no rales. He exhibits no tenderness.  Musculoskeletal: Normal range of motion. He exhibits no edema.       Legs: Lymphadenopathy:    He has no cervical adenopathy.  Neurological: He is alert and oriented to person, place, and time. No cranial nerve deficit. Coordination normal.  Skin: Skin is warm and dry. No rash noted. He is not diaphoretic. No erythema. No pallor.  Psychiatric: His behavior is normal. Judgment and thought content normal. His mood appears anxious. Cognition and memory are normal.          Assessment & Plan:   Problem List Items Addressed This Visit      Unprioritized   Essential hypertension, benign - Primary    BP Readings from Last 3 Encounters:  11/11/14 152/81  05/03/14 140/86  01/10/14 148/88   BP elevated today. Pt  not feeling well on Metoprolol. Will try change to Carvedilol. Email follow up in 2 weeks and visit in 4 weeks.      Relevant Medications   carvedilol (COREG) tablet   Other Relevant Orders   Comprehensive metabolic panel   Numbness in left leg    Numbness and pain in lateral left leg.  Symptoms and exam most consistent with IT band syndrome. Gave exercises to help with this. If no improvement, then discussed evaluation with sports medicine and/or neurology for EMG testing to evaluate area of intermittent numbness.      Temperature intolerance    Temp intolerance and weight gain. Will check thyroid function with labs.      Relevant Orders   TSH   CBC with Differential   Weight gain    Wt Readings from Last 3 Encounters:  11/11/14 242 lb 12 oz (110.111 kg)  05/03/14 232 lb 12 oz (105.575 kg)  01/10/14 225 lb (102.059 kg)   Body mass index is 32.45 kg/(m^2). Encouraged healthy diet and exercise. Will check thyroid function with labs.      Relevant Orders   Hemoglobin A1c    Other Visit Diagnoses    Encounter for immunization            Return in about 4 weeks (around 12/09/2014) for Recheck.

## 2014-11-11 NOTE — Assessment & Plan Note (Signed)
Numbness and pain in lateral left leg. Symptoms and exam most consistent with IT band syndrome. Gave exercises to help with this. If no improvement, then discussed evaluation with sports medicine and/or neurology for EMG testing to evaluate area of intermittent numbness.

## 2014-11-11 NOTE — Patient Instructions (Addendum)
Labs today.  Start Carvedilol 6.25mg  twice daily for blood pressure.  Start stretching exercises. Email with update in 1-2 weeks.  Iliotibial Band Syndrome with Rehab The iliotibial (IT) band is a tendon that connects the hip muscles to the shinbone (tibia) and to one of the bones of the pelvis (ileum). The IT band passes by the knee and is often irritated by the outer portion of the knee (lateral femoral condyle). A fluid filled sac (bursa) exists between the tendon and the bone, to cushion and reduce friction. Overuse of the tendon may cause excessive friction, which results in IT band syndrome. This condition involves inflammation of the bursa (bursitis) and/or inflammation of the IT band (tendinitis). SYMPTOMS   Pain, tenderness, swelling, warmth, or redness over the IT band, at the outer knee (above the joint).  Pain that travels up or down the thigh or leg.  Initially, pain at the beginning of an exercise, that decreases once warmed up. Eventually, pain throughout the activity, getting worse as the activity continues. May cause the athlete to stop in the middle of training or competing.  Pain that gets worse when running down hills or stairs, on banked tracks, or next to the curb on the street.  Pain that increases when the foot of the affected leg hits the ground.  Possibly, a crackling sound (crepitation) when the tendon or bursa is moved or touched. CAUSES  IT band syndrome is caused by irritation of the IT band and the underlying bursa. This eventually results in inflammation and pain. IT band syndrome is an overuse injury.  RISK INCREASES WITH:  Sports with repetitive knee-bending activities (distance running, cycling).  Incorrect training techniques, including sudden changes in the intensity, frequency, or duration of training.  Not enough rest between workouts.  Poor strength and flexibility, especially a tight IT band.  Failure to warm up properly before  activity.  Bow legs.  Arthritis of the knee. PREVENTION   Warm up and stretch properly before activity.  Allow for adequate recovery between workouts.  Maintain physical fitness:  Strength, flexibility, and endurance.  Cardiovascular fitness.  Learn and use proper training technique, including reducing running mileage, shortening stride, and avoiding running on hills and banked surfaces.  Wear arch supports (orthotics), if you have flat feet. PROGNOSIS  If treated properly, IT band syndrome usually goes away within 6 weeks of treatment. RELATED COMPLICATIONS   Longer healing time, if not properly treated, or if not given enough time to heal.  Recurring inflammation of the tendon and bursa, that may result in a chronic condition.  Recurring symptoms, if activity is resumed too soon, with overuse, with a direct blow, or with poor training technique.  Inability to complete training or competition. TREATMENT  Treatment first involves the use of ice and medicine, to reduce pain and inflammation. The use of strengthening and stretching exercises may help reduce pain with activity. These exercises may be performed at home or with a therapist. For individuals with flat feet, an arch support (orthotic) may be helpful. Some individuals find that wearing a knee sleeve or compression bandage around the knee during workouts provides some relief. Certain training techniques, such as adjusting stride length, avoiding running on hills or stairs, changing the direction you run on a circular or banked track, or changing the side of the road you run on, if you run next to the curb, may help decrease symptoms of IT band syndrome. Cyclists may need to change the seat height or foot  position on their bicycles. An injection of cortisone into the bursa may be recommended. Surgery to remove the inflamed bursa and/or part of the IT band is only considered after at least 6 months of non-surgical treatment.   MEDICATION   If pain medicine is needed, nonsteroidal anti-inflammatory medicines (aspirin and ibuprofen), or other minor pain relievers (acetaminophen), are often advised.  Do not take pain medicine for 7 days before surgery.  Prescription pain relievers may be given, if your caregiver thinks they are needed. Use only as directed and only as much as you need.  Corticosteroid injections may be given by your caregiver. These injections should be reserved for the most serious cases, because they may only be given a certain number of times. HEAT AND COLD  Cold treatment (icing) should be applied for 10 to 15 minutes every 2 to 3 hours for inflammation and pain, and immediately after activity that aggravates your symptoms. Use ice packs or an ice massage.  Heat treatment may be used before performing stretching and strengthening activities prescribed by your caregiver, physical therapist, or athletic trainer. Use a heat pack or a warm water soak. SEEK MEDICAL CARE IF:   Symptoms get worse or do not improve in 2 to 4 weeks, despite treatment.  New, unexplained symptoms develop. (Drugs used in treatment may produce side effects.) EXERCISES  RANGE OF MOTION (ROM) AND STRETCHING EXERCISES - Iliotibial Band Syndrome These exercises may help you when beginning to rehabilitate your injury. Your symptoms may go away with or without further involvement from your physician, physical therapist or athletic trainer. While completing these exercises, remember:   Restoring tissue flexibility helps normal motion to return to the joints. This allows healthier, less painful movement and activity.  An effective stretch should be held for at least 30 seconds.  A stretch should never be painful. You should only feel a gentle lengthening or release in the stretched tissue. STRETCH - Quadriceps, Prone   Lie on your stomach on a firm surface, such as a bed or padded floor.  Bend your right / left knee and  grasp your ankle. If you are unable to reach your ankle or pant leg, use a belt around your foot to lengthen your reach.  Gently pull your heel toward your buttocks. Your knee should not slide out to the side. You should feel a stretch in the front of your thigh and knee.  Hold this position for __________ seconds. Repeat __________ times. Complete this stretch __________ times per day.  STRETCH - Iliotibial Band  On the floor or bed, lie on your side, so your right / left leg is on top. Bend your knee and grab your ankle.  Slowly bring your knee back so that your thigh is in line with your trunk. Keep your heel at your buttocks and gently arch your back, so your head, shoulders and hips line up.  Slowly lower your leg so that your knee approaches the floor or bed, until you feel a gentle stretch on the outside of your right / left thigh. If you do not feel a stretch and your knee will not fall farther, place the heel of your opposite foot on top of your knee, and pull your thigh down farther.  Hold this stretch for __________ seconds. Repeat __________ times. Complete this stretch __________ times per day. STRENGTHENING EXERCISES - Iliotibial Band Syndrome Improving the flexibility of the IT band will best relieve your discomfort due to IT band syndrome. Strengthening exercises,  however, can help improve both muscle endurance and joint mechanics, reducing the factors that can contribute to this condition. Your physician, physical therapist or athletic trainer may provide you with exercises that train specific muscle groups that are especially weak. The following exercises target muscles that are often weak in people who have IT band syndrome. STRENGTH - Hip Abductors, Straight Leg Raises  Be aware of your form throughout the entire exercise, so that you exercise the correct muscles. Poor form means that you are not strengthening the correct muscles.  Lie on your side, so that your head,  shoulders, knee and hip line up. You may bend your lower knee to help maintain your balance. Your right / left leg should be on top.  Roll your hips slightly forward, so that your hips are stacked directly over each other and your right / left knee is facing forward.  Lift your top leg up 4-6 inches, leading with your heel. Be sure that your foot does not drift forward and that your knee does not roll toward the ceiling.  Hold this position for __________ seconds. You should feel the muscles in your outer hip lifting (you may not notice this until your leg begins to tire).  Slowly lower your leg to the starting position. Allow the muscles to fully relax before beginning the next repetition. Repeat __________ times. Complete this exercise __________ times per day.  STRENGTH - Quad/VMO, Isometric  Sit in a chair with your right / left knee slightly bent. With your fingertips, feel the VMO muscle (just above the inside of your knee). The VMO is important in controlling the position of your kneecap.  Keeping your fingertips on this muscle. Without actually moving your leg, attempt to drive your knee down, as if straightening your leg. You should feel your VMO tense. If you have a difficult time, you may wish to try the same exercise on your healthy knee first.  Tense this muscle as hard as you can, without increasing any knee pain.  Hold for __________ seconds. Relax the muscles slowly and completely between each repetition. Repeat __________ times. Complete this exercise __________ times per day.  Document Released: 10/14/2005 Document Revised: 01/06/2012 Document Reviewed: 01/26/2009 [facility] [address] [address] Patient Information 2015 [place], [place]. This information is not intended to replace advice given to you by your health care provider. Make sure you discuss any questions you have with your health care provider.

## 2014-12-23 ENCOUNTER — Ambulatory Visit: Payer: BLUE CROSS/BLUE SHIELD | Admitting: Internal Medicine

## 2015-02-06 ENCOUNTER — Telehealth: Payer: Self-pay | Admitting: Internal Medicine

## 2015-02-06 NOTE — Telephone Encounter (Signed)
Pt left message that he needs a consult with Dr. Gilford Rile. Pt was seen at the dentist and will need to have an extraction on Wednesday but the dentist is concerned with pt having elevated BP. Please advise pt/msn

## 2015-02-07 ENCOUNTER — Encounter: Payer: Self-pay | Admitting: Internal Medicine

## 2015-02-07 ENCOUNTER — Ambulatory Visit (INDEPENDENT_AMBULATORY_CARE_PROVIDER_SITE_OTHER): Payer: BLUE CROSS/BLUE SHIELD | Admitting: Internal Medicine

## 2015-02-07 VITALS — BP 159/100 | HR 107 | Temp 98.8°F | Ht 72.5 in | Wt 243.0 lb

## 2015-02-07 DIAGNOSIS — I10 Essential (primary) hypertension: Secondary | ICD-10-CM

## 2015-02-07 DIAGNOSIS — K047 Periapical abscess without sinus: Secondary | ICD-10-CM | POA: Diagnosis not present

## 2015-02-07 MED ORDER — AMLODIPINE BESYLATE 5 MG PO TABS: 5.0000 mg | ORAL_TABLET | Freq: Every day | ORAL | Status: DC

## 2015-02-07 NOTE — Telephone Encounter (Signed)
Please add pt to the schedule, Ive already spoke with him

## 2015-02-07 NOTE — Assessment & Plan Note (Signed)
Started on Clindamycin and prn Hydrocodone for severe pain. Scheduled for tooth extraction tomorrow. Will follow.

## 2015-02-07 NOTE — Assessment & Plan Note (Signed)
BP Readings from Last 3 Encounters:  02/07/15 159/100  11/11/14 152/81  05/03/14 140/86   BP elevated. Unable to tolerate betablockers. Will start Amlodipine 5mg  daily He will email with BP readings and follow up in 4 weeks and prn.

## 2015-02-07 NOTE — Telephone Encounter (Signed)
Please see below.

## 2015-02-07 NOTE — Telephone Encounter (Signed)
We can add this afternoon, 3:30?

## 2015-02-07 NOTE — Progress Notes (Signed)
Subjective:    Patient ID: Henry Garrett, male    DOB: May 07, 1966, 49 y.o.   MRN: [id number]  HPI  49YO male presents for acute visit.  HTN - Stopped Carvedilol several weeks ago. Medication made him feel sluggish. BP had been near 140/90 at home until this week. Developed dental abscess. Scheduled for extraction of tooth but needs to get BP down.  Past medical, surgical, family and social history per today's encounter.  Review of Systems  Constitutional: Negative for fever, chills, activity change, appetite change, fatigue and unexpected weight change.  HENT: Positive for dental problem and facial swelling. Negative for trouble swallowing.   Eyes: Negative for visual disturbance.  Respiratory: Negative for cough and shortness of breath.   Cardiovascular: Negative for chest pain, palpitations and leg swelling.  Gastrointestinal: Negative for abdominal pain and abdominal distention.  Genitourinary: Negative for dysuria, urgency and difficulty urinating.  Musculoskeletal: Negative for arthralgias and gait problem.  Skin: Positive for color change. Negative for rash.  Neurological: Negative for headaches.  Hematological: Negative for adenopathy.  Psychiatric/Behavioral: Negative for sleep disturbance and dysphoric mood. The patient is not nervous/anxious.        Objective:    BP 159/100 mmHg  Pulse 107  Temp(Src) 98.8 F (37.1 C) (Oral)  Ht 6' 0.5" (1.842 m)  Wt 243 lb (110.224 kg)  BMI 32.49 kg/m2  SpO2 100% Physical Exam  Constitutional: He is oriented to person, place, and time. He appears well-developed and well-nourished. No distress.  HENT:  Head: Normocephalic and atraumatic.    Right Ear: External ear normal.  Left Ear: External ear normal.  Nose: Nose normal.  Mouth/Throat: Oropharynx is clear and moist.  Eyes: Conjunctivae and EOM are normal. Pupils are equal, round, and reactive to light. Right eye exhibits no discharge. Left eye exhibits no discharge.  No scleral icterus.  Neck: Normal range of motion. Neck supple. No tracheal deviation present. No thyromegaly present.  Cardiovascular: Normal rate, regular rhythm and normal heart sounds.  Exam reveals no gallop and no friction rub.   No murmur heard. Pulmonary/Chest: Effort normal and breath sounds normal. No accessory muscle usage. No tachypnea. No respiratory distress. He has no decreased breath sounds. He has no wheezes. He has no rhonchi. He has no rales. He exhibits no tenderness.  Musculoskeletal: Normal range of motion. He exhibits no edema.  Lymphadenopathy:    He has no cervical adenopathy.  Neurological: He is alert and oriented to person, place, and time. No cranial nerve deficit. Coordination normal.  Skin: Skin is warm and dry. No rash noted. He is not diaphoretic. No erythema. No pallor.  Psychiatric: He has a normal mood and affect. His behavior is normal. Judgment and thought content normal.          Assessment & Plan:   Problem List Items Addressed This Visit      Unprioritized   Dental abscess    Started on Clindamycin and prn Hydrocodone for severe pain. Scheduled for tooth extraction tomorrow. Will follow.      Essential hypertension, benign - Primary    BP Readings from Last 3 Encounters:  02/07/15 159/100  11/11/14 152/81  05/03/14 140/86   BP elevated. Unable to tolerate betablockers. Will start Amlodipine 5mg  daily He will email with BP readings and follow up in 4 weeks and prn.      Relevant Medications   amLODIpine (NORVASC) tablet       Return in about 4 weeks (around 03/07/2015) for  Recheck of Blood Pressure.

## 2015-02-07 NOTE — Progress Notes (Signed)
Pre visit review using our clinic review tool, if applicable. No additional management support is needed unless otherwise documented below in the visit note. 

## 2015-02-07 NOTE — Patient Instructions (Addendum)
Start Amlodipine 5mg  daily. Take first dose tonight and second dose tomorrow morning.  Email with any questions or concerns.  Follow up 4 weeks.

## 2015-03-07 ENCOUNTER — Ambulatory Visit (INDEPENDENT_AMBULATORY_CARE_PROVIDER_SITE_OTHER): Payer: BLUE CROSS/BLUE SHIELD | Admitting: *Deleted

## 2015-03-07 VITALS — BP 140/80 | HR 97

## 2015-03-07 DIAGNOSIS — I10 Essential (primary) hypertension: Secondary | ICD-10-CM | POA: Diagnosis not present

## 2015-03-07 MED ORDER — AMLODIPINE BESYLATE 10 MG PO TABS: 10.0000 mg | ORAL_TABLET | Freq: Every day | ORAL | Status: DC

## 2015-03-07 NOTE — Progress Notes (Signed)
Please ask pt to increase Amlodipine to 10mg  daily. Recheck BP in office in 1-2 weeks.

## 2015-03-07 NOTE — Progress Notes (Signed)
Pt presents for BP check. Doing well without complaints. Taking Amlodipine 5 mg daily without difficulty. See above BP and HR. Advised to continue current therapy and we would call with any new instructions from Dr. Gilford Rile. States BPs at home 130s/80s.

## 2015-03-07 NOTE — Progress Notes (Signed)
Pt notified and  verbalized understanding. New Rx sent to pharmacy for when refill needed. Pt will call back to schedule nurse visit for BP check.

## 2015-03-07 NOTE — Addendum Note (Signed)
Addended by: Wynonia Lawman E on: 03/07/2015 08:52 AM   Modules accepted: Orders

## 2015-03-17 ENCOUNTER — Encounter: Payer: Self-pay | Admitting: Internal Medicine

## 2015-06-02 ENCOUNTER — Ambulatory Visit (INDEPENDENT_AMBULATORY_CARE_PROVIDER_SITE_OTHER): Payer: BLUE CROSS/BLUE SHIELD | Admitting: Internal Medicine

## 2015-06-02 ENCOUNTER — Encounter: Payer: Self-pay | Admitting: Internal Medicine

## 2015-06-02 VITALS — BP 149/81 | HR 102 | Temp 98.1°F | Ht 72.0 in | Wt 245.5 lb

## 2015-06-02 DIAGNOSIS — I10 Essential (primary) hypertension: Secondary | ICD-10-CM | POA: Diagnosis not present

## 2015-06-02 DIAGNOSIS — Z0000 Encounter for general adult medical examination without abnormal findings: Secondary | ICD-10-CM | POA: Diagnosis not present

## 2015-06-02 DIAGNOSIS — E669 Obesity, unspecified: Secondary | ICD-10-CM

## 2015-06-02 DIAGNOSIS — B360 Pityriasis versicolor: Secondary | ICD-10-CM | POA: Diagnosis not present

## 2015-06-02 LAB — LIPID PANEL
Cholesterol: 246 mg/dL — ABNORMAL HIGH (ref 0–200)
HDL: 45.3 mg/dL (ref >39.00)
LDL CALC: 169 mg/dL — AB (ref 0–99)
NonHDL: 200.57
TRIGLYCERIDES: 159 mg/dL — AB (ref 0.0–149.0)
Total CHOL/HDL Ratio: 5
VLDL: 31.8 mg/dL (ref 0.0–40.0)

## 2015-06-02 LAB — CBC WITH DIFFERENTIAL/PLATELET
Basophils Absolute: 0.1 10*3/uL (ref 0.0–0.1)
Basophils Relative: 0.6 % (ref 0.0–3.0)
EOS ABS: 0.2 10*3/uL (ref 0.0–0.7)
Eosinophils Relative: 2.6 % (ref 0.0–5.0)
HCT: 46.5 % (ref 39.0–52.0)
Hemoglobin: 15.9 g/dL (ref 13.0–17.0)
LYMPHS PCT: 25.4 % (ref 12.0–46.0)
Lymphs Abs: 2.1 10*3/uL (ref 0.7–4.0)
MCHC: 34.2 g/dL (ref 30.0–36.0)
MCV: 91.8 fl (ref 78.0–100.0)
Monocytes Absolute: 0.9 10*3/uL (ref 0.1–1.0)
Monocytes Relative: 11.1 % (ref 3.0–12.0)
NEUTROS PCT: 60.3 % (ref 43.0–77.0)
Neutro Abs: 5.1 10*3/uL (ref 1.4–7.7)
PLATELETS: 217 10*3/uL (ref 150.0–400.0)
RBC: 5.06 Mil/uL (ref 4.22–5.81)
RDW: 13.3 % (ref 11.5–15.5)
WBC: 8.4 10*3/uL (ref 4.0–10.5)

## 2015-06-02 LAB — COMPREHENSIVE METABOLIC PANEL
ALBUMIN: 4.4 g/dL (ref 3.5–5.2)
ALT: 45 U/L (ref 0–53)
AST: 32 U/L (ref 0–37)
Alkaline Phosphatase: 65 U/L (ref 39–117)
BUN: 11 mg/dL (ref 6–23)
CO2: 26 meq/L (ref 19–32)
CREATININE: 0.92 mg/dL (ref 0.40–1.50)
Calcium: 9.4 mg/dL (ref 8.4–10.5)
Chloride: 101 mEq/L (ref 96–112)
GFR: 93.46 mL/min (ref >60.00)
Glucose, Bld: 106 mg/dL — ABNORMAL HIGH (ref 70–99)
Potassium: 3.9 mEq/L (ref 3.5–5.1)
Sodium: 137 mEq/L (ref 135–145)
Total Bilirubin: 0.6 mg/dL (ref 0.2–1.2)
Total Protein: 7.3 g/dL (ref 6.0–8.3)

## 2015-06-02 LAB — HEMOGLOBIN A1C: HEMOGLOBIN A1C: 5.8 % (ref 4.6–6.5)

## 2015-06-02 LAB — MICROALBUMIN / CREATININE URINE RATIO
Creatinine,U: 133.9 mg/dL
Microalb Creat Ratio: 0.5 mg/g (ref 0.0–30.0)

## 2015-06-02 LAB — VITAMIN D 25 HYDROXY (VIT D DEFICIENCY, FRACTURES): VITD: 34.18 ng/mL (ref 30.00–100.00)

## 2015-06-02 LAB — TSH: TSH: 1.33 u[IU]/mL (ref 0.35–4.50)

## 2015-06-02 MED ORDER — HYDROCHLOROTHIAZIDE 12.5 MG PO CAPS: 12.5000 mg | ORAL_CAPSULE | Freq: Every day | ORAL | Status: DC

## 2015-06-02 MED ORDER — FLUCONAZOLE 150 MG PO TABS: 300.0000 mg | ORAL_TABLET | ORAL | Status: DC

## 2015-06-02 MED ORDER — AMLODIPINE BESYLATE 5 MG PO TABS: 5.0000 mg | ORAL_TABLET | Freq: Every day | ORAL | Status: DC

## 2015-06-02 NOTE — Assessment & Plan Note (Signed)
BP Readings from Last 3 Encounters:  06/02/15 149/81  03/07/15 140/80  02/07/15 159/100     BP elevated today. Will add HCTZ. Decreased Amlodipine to 5mg  daily. Recheck BP at home and in office in 4 weeks. Renal function with labs. Note that he was intolerant to betablockers because of fatigue.

## 2015-06-02 NOTE — Assessment & Plan Note (Addendum)
Wt Readings from Last 3 Encounters:  06/02/15 245 lb 8 oz (111.358 kg)  02/07/15 243 lb (110.224 kg)  11/11/14 242 lb 12 oz (110.111 kg)   Encouraged healthy diet and exercise.

## 2015-06-02 NOTE — Progress Notes (Signed)
Pre visit review using our clinic review tool, if applicable. No additional management support is needed unless otherwise documented below in the visit note. 

## 2015-06-02 NOTE — Patient Instructions (Addendum)
Start Fluconazole 300mg  weekly x 2 weeks. Start HCTZ 12.5mg  daily and reduce Amlodipine to 5mg  daily.  Health Maintenance A healthy lifestyle and preventative care can promote health and wellness.  Maintain regular health, dental, and eye exams.  Eat a healthy diet. Foods like vegetables, fruits, whole grains, low-fat dairy products, and lean protein foods contain the nutrients you need and are low in calories. Decrease your intake of foods high in solid fats, added sugars, and salt. Get information about a proper diet from your health care provider, if necessary.  Regular physical exercise is one of the most important things you can do for your health. Most adults should get at least 150 minutes of moderate-intensity exercise (any activity that increases your heart rate and causes you to sweat) each week. In addition, most adults need muscle-strengthening exercises on 2 or more days a week.   Maintain a healthy weight. The body mass index (BMI) is a screening tool to identify possible weight problems. It provides an estimate of body fat based on height and weight. Your health care provider can find your BMI and can help you achieve or maintain a healthy weight. For males 20 years and older:  A BMI below 18.5 is considered underweight.  A BMI of 18.5 to 24.9 is normal.  A BMI of 25 to 29.9 is considered overweight.  A BMI of 30 and above is considered obese.  Maintain normal blood lipids and cholesterol by exercising and minimizing your intake of saturated fat. Eat a balanced diet with plenty of fruits and vegetables. Blood tests for lipids and cholesterol should begin at age 63 and be repeated every 5 years. If your lipid or cholesterol levels are high, you are over age 30, or you are at high risk for heart disease, you may need your cholesterol levels checked more frequently.Ongoing high lipid and cholesterol levels should be treated with medicines if diet and exercise are not  working.  If you smoke, find out from your health care provider how to quit. If you do not use tobacco, do not start.  Lung cancer screening is recommended for adults aged 40-80 years who are at high risk for developing lung cancer because of a history of smoking. A yearly low-dose CT scan of the lungs is recommended for people who have at least a 30-pack-year history of smoking and are current smokers or have quit within the past 15 years. A pack year of smoking is smoking an average of 1 pack of cigarettes a day for 1 year (for example, a 30-pack-year history of smoking could mean smoking 1 pack a day for 30 years or 2 packs a day for 15 years). Yearly screening should continue until the smoker has stopped smoking for at least 15 years. Yearly screening should be stopped for people who develop a health problem that would prevent them from having lung cancer treatment.  If you choose to drink alcohol, do not have more than 2 drinks per day. One drink is considered to be 12 oz (360 mL) of beer, 5 oz (150 mL) of wine, or 1.5 oz (45 mL) of liquor.  Avoid the use of street drugs. Do not share needles with anyone. Ask for help if you need support or instructions about stopping the use of drugs.  High blood pressure causes heart disease and increases the risk of stroke. Blood pressure should be checked at least every 1-2 years. Ongoing high blood pressure should be treated with medicines if weight loss  and exercise are not effective.  If you are 36-10 years old, ask your health care provider if you should take aspirin to prevent heart disease.  Diabetes screening involves taking a blood sample to check your fasting blood sugar level. This should be done once every 3 years after age 74 if you are at a normal weight and without risk factors for diabetes. Testing should be considered at a younger age or be carried out more frequently if you are overweight and have at least 1 risk factor for  diabetes.  Colorectal cancer can be detected and often prevented. Most routine colorectal cancer screening begins at the age of 66 and continues through age 74. However, your health care provider may recommend screening at an earlier age if you have risk factors for colon cancer. On a yearly basis, your health care provider may provide home test kits to check for hidden blood in the stool. A small camera at the end of a tube may be used to directly examine the colon (sigmoidoscopy or colonoscopy) to detect the earliest forms of colorectal cancer. Talk to your health care provider about this at age 39 when routine screening begins. A direct exam of the colon should be repeated every 5-10 years through age 43, unless early forms of precancerous polyps or small growths are found.  People who are at an increased risk for hepatitis B should be screened for this virus. You are considered at high risk for hepatitis B if:  You were born in a country where hepatitis B occurs often. Talk with your health care provider about which countries are considered high risk.  Your parents were born in a high-risk country and you have not received a shot to protect against hepatitis B (hepatitis B vaccine).  You have HIV or AIDS.  You use needles to inject street drugs.  You live with, or have sex with, someone who has hepatitis B.  You are a man who has sex with other men (MSM).  You get hemodialysis treatment.  You take certain medicines for conditions like cancer, organ transplantation, and autoimmune conditions.  Hepatitis C blood testing is recommended for all people born from [birth year] through 1964 and any individual with known risk factors for hepatitis C.  Healthy men should no longer receive prostate-specific antigen (PSA) blood tests as part of routine cancer screening. Talk to your health care provider about prostate cancer screening.  Testicular cancer screening is not recommended for adolescents or  adult males who have no symptoms. Screening includes self-exam, a health care provider exam, and other screening tests. Consult with your health care provider about any symptoms you have or any concerns you have about testicular cancer.  Practice safe sex. Use condoms and avoid high-risk sexual practices to reduce the spread of sexually transmitted infections (STIs).  You should be screened for STIs, including gonorrhea and chlamydia if:  You are sexually active and are younger than 24 years.  You are older than 24 years, and your health care provider tells you that you are at risk for this type of infection.  Your sexual activity has changed since you were last screened, and you are at an increased risk for chlamydia or gonorrhea. Ask your health care provider if you are at risk.  If you are at risk of being infected with HIV, it is recommended that you take a prescription medicine daily to prevent HIV infection. This is called pre-exposure prophylaxis (PrEP). You are considered at risk if:  You are a man who has sex with other men (MSM).  You are a heterosexual man who is sexually active with multiple partners.  You take drugs by injection.  You are sexually active with a partner who has HIV.  Talk with your health care provider about whether you are at high risk of being infected with HIV. If you choose to begin PrEP, you should first be tested for HIV. You should then be tested every 3 months for as long as you are taking PrEP.  Use sunscreen. Apply sunscreen liberally and repeatedly throughout the day. You should seek shade when your shadow is shorter than you. Protect yourself by wearing long sleeves, pants, a wide-brimmed hat, and sunglasses year round whenever you are outdoors.  Tell your health care provider of new moles or changes in moles, especially if there is a change in shape or color. Also, tell your health care provider if a mole is larger than the size of a pencil  eraser.  A one-time screening for abdominal aortic aneurysm (AAA) and surgical repair of large AAAs by ultrasound is recommended for men aged 61-75 years who are current or former smokers.  Stay current with your vaccines (immunizations). Document Released: 04/11/2008 Document Revised: 10/19/2013 Document Reviewed: 03/11/2011 [facility] Patient Information 2015 [place], [place]. This information is not intended to replace advice given to you by your health care provider. Make sure you discuss any questions you have with your health care provider.

## 2015-06-02 NOTE — Assessment & Plan Note (Signed)
General medical exam normal today except as noted. Encouraged healthy diet and exercise. Labs as ordered. Encouraged flu vaccine this fall.

## 2015-06-02 NOTE — Progress Notes (Signed)
Subjective:    Patient ID: Henry Garrett, male    DOB: Aug 14, 1966, 49 y.o.   MRN: [id number]  HPI  49YO male presents for annual exam.  HTN - BP 130/70s at home. Taking 5mg  Amlodipine because of concern about leg swelling. No CP, HA, palpitations.  Rash - Noticed rash over back. This has occurred before in the summer months. Not painful or itchy. Not using anything for this.   Wt Readings from Last 3 Encounters:  06/02/15 245 lb 8 oz (111.358 kg)  02/07/15 243 lb (110.224 kg)  11/11/14 242 lb 12 oz (110.111 kg)     Past medical, surgical, family and social history per today's encounter.   Review of Systems  Constitutional: Negative for fever, chills, activity change, appetite change, fatigue and unexpected weight change.  Eyes: Negative for visual disturbance.  Respiratory: Negative for cough and shortness of breath.   Cardiovascular: Positive for leg swelling. Negative for chest pain and palpitations.  Gastrointestinal: Negative for nausea, vomiting, abdominal pain, diarrhea, constipation and abdominal distention.  Genitourinary: Negative for dysuria, urgency and difficulty urinating.  Musculoskeletal: Negative for arthralgias and gait problem.  Skin: Positive for color change and rash.  Hematological: Negative for adenopathy.  Psychiatric/Behavioral: Negative for sleep disturbance and dysphoric mood. The patient is nervous/anxious.        Objective:    BP 149/81 mmHg  Pulse 102  Temp(Src) 98.1 F (36.7 C) (Oral)  Ht 6' (1.829 m)  Wt 245 lb 8 oz (111.358 kg)  BMI 33.29 kg/m2  SpO2 100% Physical Exam  Constitutional: He is oriented to person, place, and time. He appears well-developed and well-nourished. No distress.  HENT:  Head: Normocephalic and atraumatic.  Right Ear: External ear normal.  Left Ear: External ear normal.  Nose: Nose normal.  Mouth/Throat: Oropharynx is clear and moist. No oropharyngeal exudate.  Eyes: Conjunctivae and EOM are normal.  Pupils are equal, round, and reactive to light. Right eye exhibits no discharge. Left eye exhibits no discharge. No scleral icterus.  Neck: Normal range of motion. Neck supple. No tracheal deviation present. No thyromegaly present.  Cardiovascular: Normal rate, regular rhythm and normal heart sounds.  Exam reveals no gallop and no friction rub.   No murmur heard. Pulmonary/Chest: Effort normal and breath sounds normal. No respiratory distress. He has no wheezes. He has no rales. He exhibits no tenderness.  Abdominal: Soft. Bowel sounds are normal. He exhibits no distension and no mass. There is no tenderness. There is no rebound and no guarding.  Musculoskeletal: Normal range of motion. He exhibits no edema.  Lymphadenopathy:    He has no cervical adenopathy.  Neurological: He is alert and oriented to person, place, and time. No cranial nerve deficit. Coordination normal.  Skin: Skin is warm and dry. Rash noted. Rash is maculopapular (scaling rash with central clearing right upper back). He is not diaphoretic. There is erythema. No pallor.  Psychiatric: His behavior is normal. Judgment and thought content normal. His mood appears anxious. Cognition and memory are normal.          Assessment & Plan:   Problem List Items Addressed This Visit      Unprioritized   Essential hypertension, benign    BP Readings from Last 3 Encounters:  06/02/15 149/81  03/07/15 140/80  02/07/15 159/100     BP elevated today. Will add HCTZ. Decreased Amlodipine to 5mg  daily. Recheck BP at home and in office in 4 weeks. Renal function with labs. Note that  he was intolerant to betablockers because of fatigue.      Relevant Medications   amLODipine (NORVASC) 5 MG tablet   hydrochlorothiazide (MICROZIDE) 12.5 MG capsule   Obesity (BMI 30-39.9)    Wt Readings from Last 3 Encounters:  06/02/15 245 lb 8 oz (111.358 kg)  02/07/15 243 lb (110.224 kg)  11/11/14 242 lb 12 oz (110.111 kg)   Encouraged healthy  diet and exercise.       Routine general medical examination at a health care facility - Primary    General medical exam normal today except as noted. Encouraged healthy diet and exercise. Labs as ordered. Encouraged flu vaccine this fall.       Relevant Orders   CBC with Differential/Platelet   Comprehensive metabolic panel   Lipid panel   Microalbumin / creatinine urine ratio   Vit D  25 hydroxy (rtn osteoporosis monitoring)   TSH   Hemoglobin A1c    Other Visit Diagnoses    Tinea versicolor        Relevant Medications    fluconazole (DIFLUCAN) 150 MG tablet        Return in about 4 weeks (around 06/30/2015) for Recheck of Blood Pressure.

## 2015-06-09 ENCOUNTER — Encounter: Payer: Self-pay | Admitting: Internal Medicine

## 2015-07-07 ENCOUNTER — Ambulatory Visit (INDEPENDENT_AMBULATORY_CARE_PROVIDER_SITE_OTHER): Payer: BLUE CROSS/BLUE SHIELD | Admitting: Internal Medicine

## 2015-07-07 ENCOUNTER — Encounter: Payer: Self-pay | Admitting: Internal Medicine

## 2015-07-07 VITALS — BP 145/89 | HR 110 | Temp 98.2°F | Ht 72.0 in | Wt 248.1 lb

## 2015-07-07 DIAGNOSIS — Z23 Encounter for immunization: Secondary | ICD-10-CM | POA: Diagnosis not present

## 2015-07-07 DIAGNOSIS — I10 Essential (primary) hypertension: Secondary | ICD-10-CM

## 2015-07-07 MED ORDER — HYDROCHLOROTHIAZIDE 12.5 MG PO CAPS: 12.5000 mg | ORAL_CAPSULE | Freq: Every day | ORAL | Status: DC

## 2015-07-07 MED ORDER — AMLODIPINE BESYLATE 5 MG PO TABS: 5.0000 mg | ORAL_TABLET | Freq: Every day | ORAL | Status: DC

## 2015-07-07 NOTE — Progress Notes (Signed)
Pre visit review using our clinic review tool, if applicable. No additional management support is needed unless otherwise documented below in the visit note. 

## 2015-07-07 NOTE — Patient Instructions (Signed)
Follow up in 6 months 

## 2015-07-07 NOTE — Progress Notes (Signed)
Subjective:    Patient ID: Henry Garrett, male    DOB: 09-16-1966, 49 y.o.   MRN: [id number]  HPI  49YO male presents for follow up.  HTN - BP at home better controlled 725D systolic. Compliant with medications. He reports HR also better controlled at home. Feeling well. No concerns.    Past Medical History  Diagnosis Date  . Sleep apnea     CPAP 12-20cmH2O   Family History  Problem Relation Age of Onset  . Heart disease Brother     heart valve replacement, [place]  . Arthritis Mother     s/p bilateral knee replacement   Past Surgical History  Procedure Laterality Date  . Tonsilectomy, adenoidectomy, bilateral myringotomy and tubes    . Hernia repair      infant   Social History   Social History  . Marital Status: Married    Spouse Name: N/A  . Number of Children: N/A  . Years of Education: N/A   Social History Main Topics  . Smoking status: Former Smoker    Quit date: 03/17/2000  . Smokeless tobacco: Never Used  . Alcohol Use: 12.0 oz/week    20 Cans of beer per week  . Drug Use: No  . Sexual Activity: Not Asked   Other Topics Concern  . None   Social History Narrative   Lives in [place]. From [place]. Married, 3 children 19,15,11.  2 dogs      Work - Charity fundraiser for [employer]  Constitutional: Negative for fever, chills, activity change, appetite change, fatigue and unexpected weight change.  Eyes: Negative for visual disturbance.  Respiratory: Negative for cough, shortness of breath and wheezing.   Cardiovascular: Negative for chest pain, palpitations and leg swelling.  Gastrointestinal: Negative for abdominal pain and abdominal distention.  Genitourinary: Negative for dysuria, urgency and difficulty urinating.  Musculoskeletal: Negative for arthralgias and gait problem.  Skin: Negative for color change and rash.  Neurological: Negative for headaches.  Hematological: Negative for adenopathy.  Psychiatric/Behavioral: Negative  for sleep disturbance and dysphoric mood. The patient is not nervous/anxious.        Objective:    BP 145/89 mmHg  Pulse 110  Temp(Src) 98.2 F (36.8 C) (Oral)  Ht 6' (1.829 m)  Wt 248 lb 2 oz (112.549 kg)  BMI 33.64 kg/m2  SpO2 99% Physical Exam  Constitutional: He is oriented to person, place, and time. He appears well-developed and well-nourished. No distress.  HENT:  Head: Normocephalic and atraumatic.  Right Ear: External ear normal.  Left Ear: External ear normal.  Nose: Nose normal.  Mouth/Throat: Oropharynx is clear and moist. No oropharyngeal exudate.  Eyes: Conjunctivae and EOM are normal. Pupils are equal, round, and reactive to light. Right eye exhibits no discharge. Left eye exhibits no discharge. No scleral icterus.  Neck: Normal range of motion. Neck supple. No tracheal deviation present. No thyromegaly present.  Cardiovascular: Normal rate, regular rhythm and normal heart sounds.  Exam reveals no gallop and no friction rub.   No murmur heard. Pulmonary/Chest: Effort normal and breath sounds normal. No accessory muscle usage. No tachypnea. No respiratory distress. He has no decreased breath sounds. He has no wheezes. He has no rhonchi. He has no rales. He exhibits no tenderness.  Musculoskeletal: Normal range of motion. He exhibits no edema.  Lymphadenopathy:    He has no cervical adenopathy.  Neurological: He is alert and oriented to person, place, and time. No cranial nerve  deficit. Coordination normal.  Skin: Skin is warm and dry. No rash noted. He is not diaphoretic. No erythema. No pallor.  Psychiatric: His behavior is normal. Judgment and thought content normal. His mood appears anxious.          Assessment & Plan:   Problem List Items Addressed This Visit      Unprioritized   Essential hypertension, benign - Primary    BP Readings from Last 3 Encounters:  07/07/15 145/89  06/02/15 149/81  03/07/15 140/80   BP well controlled at home, higher in  office, likely related to anxiety. Will continue HCTZ and Amlodipine.      Relevant Medications   hydrochlorothiazide (MICROZIDE) 12.5 MG capsule   amLODipine (NORVASC) 5 MG tablet    Other Visit Diagnoses    Encounter for immunization            Return in about 6 months (around 01/04/2016) for Physical.

## 2015-07-07 NOTE — Assessment & Plan Note (Signed)
BP Readings from Last 3 Encounters:  07/07/15 145/89  06/02/15 149/81  03/07/15 140/80   BP well controlled at home, higher in office, likely related to anxiety. Will continue HCTZ and Amlodipine.

## 2016-06-17 ENCOUNTER — Encounter: Payer: BLUE CROSS/BLUE SHIELD | Admitting: Family Medicine

## 2016-06-28 ENCOUNTER — Encounter: Payer: Self-pay | Admitting: Family Medicine

## 2016-06-28 ENCOUNTER — Ambulatory Visit (INDEPENDENT_AMBULATORY_CARE_PROVIDER_SITE_OTHER): Payer: BLUE CROSS/BLUE SHIELD | Admitting: Family Medicine

## 2016-06-28 DIAGNOSIS — Z23 Encounter for immunization: Secondary | ICD-10-CM

## 2016-06-28 DIAGNOSIS — Z130 Encounter for screening for diseases of the blood and blood-forming organs and certain disorders involving the immune mechanism: Secondary | ICD-10-CM | POA: Diagnosis not present

## 2016-06-28 DIAGNOSIS — E785 Hyperlipidemia, unspecified: Secondary | ICD-10-CM | POA: Insufficient documentation

## 2016-06-28 DIAGNOSIS — I10 Essential (primary) hypertension: Secondary | ICD-10-CM

## 2016-06-28 DIAGNOSIS — R7303 Prediabetes: Secondary | ICD-10-CM

## 2016-06-28 DIAGNOSIS — G4733 Obstructive sleep apnea (adult) (pediatric): Secondary | ICD-10-CM

## 2016-06-28 LAB — COMPREHENSIVE METABOLIC PANEL
ALBUMIN: 4.4 g/dL (ref 3.5–5.2)
ALT: 69 U/L — ABNORMAL HIGH (ref 0–53)
AST: 59 U/L — ABNORMAL HIGH (ref 0–37)
Alkaline Phosphatase: 73 U/L (ref 39–117)
BUN: 10 mg/dL (ref 6–23)
CHLORIDE: 98 meq/L (ref 96–112)
CO2: 28 meq/L (ref 19–32)
Calcium: 9.2 mg/dL (ref 8.4–10.5)
Creatinine, Ser: 0.99 mg/dL (ref 0.40–1.50)
GFR: 85.49 mL/min (ref >60.00)
Glucose, Bld: 119 mg/dL — ABNORMAL HIGH (ref 70–99)
POTASSIUM: 4.3 meq/L (ref 3.5–5.1)
SODIUM: 135 meq/L (ref 135–145)
Total Bilirubin: 0.6 mg/dL (ref 0.2–1.2)
Total Protein: 7.8 g/dL (ref 6.0–8.3)

## 2016-06-28 LAB — LIPID PANEL
Cholesterol: 259 mg/dL — ABNORMAL HIGH (ref 0–200)
HDL: 49.5 mg/dL (ref >39.00)
LDL Cholesterol: 190 mg/dL — ABNORMAL HIGH (ref 0–99)
NONHDL: 209.1
Total CHOL/HDL Ratio: 5
Triglycerides: 96 mg/dL (ref 0.0–149.0)
VLDL: 19.2 mg/dL (ref 0.0–40.0)

## 2016-06-28 LAB — CBC
HEMATOCRIT: 47.2 % (ref 39.0–52.0)
Hemoglobin: 16.3 g/dL (ref 13.0–17.0)
MCHC: 34.6 g/dL (ref 30.0–36.0)
MCV: 92 fl (ref 78.0–100.0)
Platelets: 215 10*3/uL (ref 150.0–400.0)
RBC: 5.13 Mil/uL (ref 4.22–5.81)
RDW: 12.8 % (ref 11.5–15.5)
WBC: 6.9 10*3/uL (ref 4.0–10.5)

## 2016-06-28 LAB — HEMOGLOBIN A1C: HEMOGLOBIN A1C: 6.1 % (ref 4.6–6.5)

## 2016-06-28 MED ORDER — AMLODIPINE BESYLATE 5 MG PO TABS: 5.0000 mg | ORAL_TABLET | Freq: Every day | ORAL | 3 refills | Status: DC

## 2016-06-28 MED ORDER — HYDROCHLOROTHIAZIDE 25 MG PO TABS: 25.0000 mg | ORAL_TABLET | Freq: Every day | ORAL | 3 refills | Status: DC

## 2016-06-28 NOTE — Assessment & Plan Note (Signed)
Stable. Compliant with CPAP

## 2016-06-28 NOTE — Patient Instructions (Signed)
Follow up in 3-6 months.  We will call with your lab results.  Take care  Dr. Lacinda Axon   Health Maintenance, Male A healthy lifestyle and preventative care can promote health and wellness.  Maintain regular health, dental, and eye exams.  Eat a healthy diet. Foods like vegetables, fruits, whole grains, low-fat dairy products, and lean protein foods contain the nutrients you need and are low in calories. Decrease your intake of foods high in solid fats, added sugars, and salt. Get information about a proper diet from your health care provider, if necessary.  Regular physical exercise is one of the most important things you can do for your health. Most adults should get at least 150 minutes of moderate-intensity exercise (any activity that increases your heart rate and causes you to sweat) each week. In addition, most adults need muscle-strengthening exercises on 2 or more days a week.   Maintain a healthy weight. The body mass index (BMI) is a screening tool to identify possible weight problems. It provides an estimate of body fat based on height and weight. Your health care provider can find your BMI and can help you achieve or maintain a healthy weight. For males 20 years and older:  A BMI below 18.5 is considered underweight.  A BMI of 18.5 to 24.9 is normal.  A BMI of 25 to 29.9 is considered overweight.  A BMI of 30 and above is considered obese.  Maintain normal blood lipids and cholesterol by exercising and minimizing your intake of saturated fat. Eat a balanced diet with plenty of fruits and vegetables. Blood tests for lipids and cholesterol should begin at age 23 and be repeated every 5 years. If your lipid or cholesterol levels are high, you are over age 48, or you are at high risk for heart disease, you may need your cholesterol levels checked more frequently.Ongoing high lipid and cholesterol levels should be treated with medicines if diet and exercise are not working.  If you  smoke, find out from your health care provider how to quit. If you do not use tobacco, do not start.  Lung cancer screening is recommended for adults aged 3-80 years who are at high risk for developing lung cancer because of a history of smoking. A yearly low-dose CT scan of the lungs is recommended for people who have at least a 30-pack-year history of smoking and are current smokers or have quit within the past 15 years. A pack year of smoking is smoking an average of 1 pack of cigarettes a day for 1 year (for example, a 30-pack-year history of smoking could mean smoking 1 pack a day for 30 years or 2 packs a day for 15 years). Yearly screening should continue until the smoker has stopped smoking for at least 15 years. Yearly screening should be stopped for people who develop a health problem that would prevent them from having lung cancer treatment.  If you choose to drink alcohol, do not have more than 2 drinks per day. One drink is considered to be 12 oz (360 mL) of beer, 5 oz (150 mL) of wine, or 1.5 oz (45 mL) of liquor.  Avoid the use of street drugs. Do not share needles with anyone. Ask for help if you need support or instructions about stopping the use of drugs.  High blood pressure causes heart disease and increases the risk of stroke. High blood pressure is more likely to develop in:  People who have blood pressure in the end  of the normal range (100-139/85-89 mm Hg).  People who are overweight or obese.  People who are African American.  If you are 46-59 years of age, have your blood pressure checked every 3-5 years. If you are 66 years of age or older, have your blood pressure checked every year. You should have your blood pressure measured twice--once when you are at a hospital or clinic, and once when you are not at a hospital or clinic. Record the average of the two measurements. To check your blood pressure when you are not at a hospital or clinic, you can use:  An automated  blood pressure machine at a pharmacy.  A home blood pressure monitor.  If you are 34-70 years old, ask your health care provider if you should take aspirin to prevent heart disease.  Diabetes screening involves taking a blood sample to check your fasting blood sugar level. This should be done once every 3 years after age 53 if you are at a normal weight and without risk factors for diabetes. Testing should be considered at a younger age or be carried out more frequently if you are overweight and have at least 1 risk factor for diabetes.  Colorectal cancer can be detected and often prevented. Most routine colorectal cancer screening begins at the age of 59 and continues through age 24. However, your health care provider may recommend screening at an earlier age if you have risk factors for colon cancer. On a yearly basis, your health care provider may provide home test kits to check for hidden blood in the stool. A small camera at the end of a tube may be used to directly examine the colon (sigmoidoscopy or colonoscopy) to detect the earliest forms of colorectal cancer. Talk to your health care provider about this at age 57 when routine screening begins. A direct exam of the colon should be repeated every 5-10 years through age 50, unless early forms of precancerous polyps or small growths are found.  People who are at an increased risk for hepatitis B should be screened for this virus. You are considered at high risk for hepatitis B if:  You were born in a country where hepatitis B occurs often. Talk with your health care provider about which countries are considered high risk.  Your parents were born in a high-risk country and you have not received a shot to protect against hepatitis B (hepatitis B vaccine).  You have HIV or AIDS.  You use needles to inject street drugs.  You live with, or have sex with, someone who has hepatitis B.  You are a man who has sex with other men (MSM).  You get  hemodialysis treatment.  You take certain medicines for conditions like cancer, organ transplantation, and autoimmune conditions.  Hepatitis C blood testing is recommended for all people born from [birth year] through 1964 and any individual with known risk factors for hepatitis C.  Healthy men should no longer receive prostate-specific antigen (PSA) blood tests as part of routine cancer screening. Talk to your health care provider about prostate cancer screening.  Testicular cancer screening is not recommended for adolescents or adult males who have no symptoms. Screening includes self-exam, a health care provider exam, and other screening tests. Consult with your health care provider about any symptoms you have or any concerns you have about testicular cancer.  Practice safe sex. Use condoms and avoid high-risk sexual practices to reduce the spread of sexually transmitted infections (STIs).  You should be  screened for STIs, including gonorrhea and chlamydia if:  You are sexually active and are younger than 24 years.  You are older than 24 years, and your health care provider tells you that you are at risk for this type of infection.  Your sexual activity has changed since you were last screened, and you are at an increased risk for chlamydia or gonorrhea. Ask your health care provider if you are at risk.  If you are at risk of being infected with HIV, it is recommended that you take a prescription medicine daily to prevent HIV infection. This is called pre-exposure prophylaxis (PrEP). You are considered at risk if:  You are a man who has sex with other men (MSM).  You are a heterosexual man who is sexually active with multiple partners.  You take drugs by injection.  You are sexually active with a partner who has HIV.  Talk with your health care provider about whether you are at high risk of being infected with HIV. If you choose to begin PrEP, you should first be tested for HIV. You should  then be tested every 3 months for as long as you are taking PrEP.  Use sunscreen. Apply sunscreen liberally and repeatedly throughout the day. You should seek shade when your shadow is shorter than you. Protect yourself by wearing long sleeves, pants, a wide-brimmed hat, and sunglasses year round whenever you are outdoors.  Tell your health care provider of new moles or changes in moles, especially if there is a change in shape or color. Also, tell your health care provider if a mole is larger than the size of a pencil eraser.  A one-time screening for abdominal aortic aneurysm (AAA) and surgical repair of large AAAs by ultrasound is recommended for men aged 76-75 years who are current or former smokers.  Stay current with your vaccines (immunizations).   This information is not intended to replace advice given to you by your health care provider. Make sure you discuss any questions you have with your health care provider.   Document Released: 04/11/2008 Document Revised: 11/04/2014 Document Reviewed: 03/11/2011 Elsevier Interactive Patient Education [employer].

## 2016-06-28 NOTE — Progress Notes (Signed)
Subjective:  Patient ID: Henry Garrett, male    DOB: November 13, 1965  Age: 50 y.o. MRN: [id number]  CC: Follow up  HPI:  50 year old male with hypertension, OSA, hyperlipidemia presents for follow-up.  HTN  Uncontrolled.  Not at goal.  In addition to his blood pressure being elevated today he states that his blood pressure is consistently elevated greater than 465 systolic at home.  He is currently taking amlodipine and HCTZ. Will discuss today.  OSA  Stable. Compliant with CPAP.  HLD  Uncontrolled.  Untreated.  Most recent LDL was 169.  Given his blood pressure today, ASCVD risk score greater than 7.5%. Will discuss today.  Social Hx   Social History   Social History  . Marital status: Married    Spouse name: N/A  . Number of children: N/A  . Years of education: N/A   Social History Main Topics  . Smoking status: Former Smoker    Quit date: 03/17/2000  . Smokeless tobacco: Never Used  . Alcohol use 12.0 oz/week    20 Cans of beer per week  . Drug use: No  . Sexual activity: Not Asked   Other Topics Concern  . None   Social History Narrative   Lives in [place]. From [place]. Married, 3 children 19,15,11.  2 dogs      Work - Charity fundraiser for [employer]  Constitutional: Negative.   Respiratory: Negative.   Cardiovascular: Negative.    Objective:  BP (!) 164/92 (BP Location: Right Arm, Cuff Size: Large)   Pulse 92   Temp 98.3 F (36.8 C) (Oral)   Resp 16   Ht 6' (1.829 m)   Wt 247 lb (112 kg)   BMI 33.50 kg/m   BP/Weight 06/28/2016 04/04/1274 11/04/15  Systolic BP 494 496 759  Diastolic BP 92 89 81  Wt. (Lbs) 247 248.13 245.5  BMI 33.5 33.64 33.29   Physical Exam  Constitutional: He is oriented to person, place, and time. He appears well-developed. No distress.  Cardiovascular: Normal rate and regular rhythm.   Pulmonary/Chest: Effort normal. He has no wheezes. He has no rales.  Neurological: He is alert and oriented to  person, place, and time.  Psychiatric: He has a normal mood and affect.  Vitals reviewed.  Lab Results  Component Value Date   WBC 6.9 06/28/2016   HGB 16.3 06/28/2016   HCT 47.2 06/28/2016   PLT 215.0 06/28/2016   GLUCOSE 119 (H) 06/28/2016   CHOL 259 (H) 06/28/2016   TRIG 96.0 06/28/2016   HDL 49.50 06/28/2016   LDLDIRECT 151.7 03/17/2013   LDLCALC 190 (H) 06/28/2016   ALT 69 (H) 06/28/2016   AST 59 (H) 06/28/2016   NA 135 06/28/2016   K 4.3 06/28/2016   CL 98 06/28/2016   CREATININE 0.99 06/28/2016   BUN 10 06/28/2016   CO2 28 06/28/2016   TSH 1.33 06/02/2015   HGBA1C 6.1 06/28/2016   MICROALBUR <0.7 06/02/2015   Assessment & Plan:   Problem List Items Addressed This Visit    Essential hypertension, benign    Uncontrolled. Increasing HCTZ to 25 mg daily. Continue Norvasc 5 mg daily.      Relevant Medications   amLODipine (NORVASC) 5 MG tablet   hydrochlorothiazide (HYDRODIURIL) 25 MG tablet   OSA (obstructive sleep apnea)    Stable. Compliant with CPAP.      Hyperlipidemia    Uncontrolled. Discussed statin therapy today. Patient would like to wait until repeat lipid  panel. Lipid panel today.      Relevant Medications   amLODipine (NORVASC) 5 MG tablet   hydrochlorothiazide (HYDRODIURIL) 25 MG tablet   Other Relevant Orders   Lipid Profile (Completed)    Other Visit Diagnoses    Encounter for immunization       Relevant Medications   amLODipine (NORVASC) 5 MG tablet   hydrochlorothiazide (HYDRODIURIL) 25 MG tablet   Other Relevant Orders   Flu Vaccine QUAD 36+ mos IM (Completed)   Prediabetes       Relevant Orders   Comp Met (CMET) (Completed)   HgB A1c (Completed)   Screening for deficiency anemia       Relevant Orders   CBC (Completed)      Meds ordered this encounter  Medications  . amLODipine (NORVASC) 5 MG tablet    Sig: Take 1 tablet (5 mg total) by mouth daily.    Dispense:  90 tablet    Refill:  3  . hydrochlorothiazide  (HYDRODIURIL) 25 MG tablet    Sig: Take 1 tablet (25 mg total) by mouth daily.    Dispense:  90 tablet    Refill:  3    Follow-up: Return in about 3 months (around 09/27/2016).  [place]

## 2016-06-28 NOTE — Assessment & Plan Note (Signed)
Uncontrolled. Increasing HCTZ to 25 mg daily. Continue Norvasc 5 mg daily.

## 2016-06-28 NOTE — Assessment & Plan Note (Signed)
Uncontrolled. Discussed statin therapy today. Patient would like to wait until repeat lipid panel. Lipid panel today.

## 2016-07-16 ENCOUNTER — Telehealth: Payer: Self-pay | Admitting: *Deleted

## 2016-07-16 ENCOUNTER — Other Ambulatory Visit: Payer: Self-pay | Admitting: Family Medicine

## 2016-07-16 NOTE — Telephone Encounter (Signed)
Pt coming in for labs on Friday (former pt of [facility]). It looks as though you are his new PCP. Please place lab orders.  Thanks

## 2016-07-17 ENCOUNTER — Other Ambulatory Visit: Payer: Self-pay | Admitting: Family Medicine

## 2016-07-17 DIAGNOSIS — R945 Abnormal results of liver function studies: Principal | ICD-10-CM

## 2016-07-17 DIAGNOSIS — R7989 Other specified abnormal findings of blood chemistry: Secondary | ICD-10-CM

## 2016-07-19 ENCOUNTER — Other Ambulatory Visit (INDEPENDENT_AMBULATORY_CARE_PROVIDER_SITE_OTHER): Payer: BLUE CROSS/BLUE SHIELD

## 2016-07-19 ENCOUNTER — Other Ambulatory Visit: Payer: Self-pay | Admitting: Family Medicine

## 2016-07-19 DIAGNOSIS — R945 Abnormal results of liver function studies: Principal | ICD-10-CM

## 2016-07-19 DIAGNOSIS — R7989 Other specified abnormal findings of blood chemistry: Secondary | ICD-10-CM | POA: Diagnosis not present

## 2016-07-19 LAB — COMPREHENSIVE METABOLIC PANEL
ALK PHOS: 68 U/L (ref 39–117)
ALT: 58 U/L — ABNORMAL HIGH (ref 0–53)
AST: 41 U/L — ABNORMAL HIGH (ref 0–37)
Albumin: 4 g/dL (ref 3.5–5.2)
BUN: 12 mg/dL (ref 6–23)
CALCIUM: 9.1 mg/dL (ref 8.4–10.5)
CO2: 29 mEq/L (ref 19–32)
Chloride: 98 mEq/L (ref 96–112)
Creatinine, Ser: 1.08 mg/dL (ref 0.40–1.50)
GFR: 77.31 mL/min (ref >60.00)
GLUCOSE: 133 mg/dL — AB (ref 70–99)
POTASSIUM: 3.8 meq/L (ref 3.5–5.1)
Sodium: 137 mEq/L (ref 135–145)
TOTAL PROTEIN: 7.4 g/dL (ref 6.0–8.3)
Total Bilirubin: 0.7 mg/dL (ref 0.2–1.2)

## 2016-07-22 ENCOUNTER — Telehealth: Payer: Self-pay | Admitting: *Deleted

## 2016-07-22 NOTE — Telephone Encounter (Signed)
Please make pt aware of labs.

## 2016-07-22 NOTE — Telephone Encounter (Signed)
Patient requested lab results  Pt contact [phone]

## 2016-07-23 NOTE — Telephone Encounter (Signed)
Patient advised of results see noted on labs

## 2016-07-25 ENCOUNTER — Ambulatory Visit: Payer: BLUE CROSS/BLUE SHIELD

## 2016-07-29 ENCOUNTER — Ambulatory Visit
Admission: RE | Admit: 2016-07-29 | Discharge: 2016-07-29 | Disposition: A | Discharge status: Home / Self Care | Payer: BLUE CROSS/BLUE SHIELD | Source: Ambulatory Visit | Attending: Family Medicine | Admitting: Family Medicine

## 2016-07-29 DIAGNOSIS — K824 Cholesterolosis of gallbladder: Secondary | ICD-10-CM | POA: Diagnosis not present

## 2016-07-29 DIAGNOSIS — R7989 Other specified abnormal findings of blood chemistry: Secondary | ICD-10-CM | POA: Insufficient documentation

## 2016-07-29 DIAGNOSIS — R932 Abnormal findings on diagnostic imaging of liver and biliary tract: Secondary | ICD-10-CM | POA: Diagnosis not present

## 2016-07-29 DIAGNOSIS — R945 Abnormal results of liver function studies: Secondary | ICD-10-CM

## 2016-07-30 ENCOUNTER — Encounter: Payer: Self-pay | Admitting: Family Medicine

## 2016-08-06 ENCOUNTER — Other Ambulatory Visit: Payer: BLUE CROSS/BLUE SHIELD

## 2016-09-27 ENCOUNTER — Encounter: Payer: Self-pay | Admitting: Family Medicine

## 2016-09-27 ENCOUNTER — Ambulatory Visit (INDEPENDENT_AMBULATORY_CARE_PROVIDER_SITE_OTHER): Payer: BLUE CROSS/BLUE SHIELD | Admitting: Family Medicine

## 2016-09-27 VITALS — BP 150/80 | HR 118 | Temp 97.7°F | Resp 14 | Wt 240.5 lb

## 2016-09-27 DIAGNOSIS — I10 Essential (primary) hypertension: Secondary | ICD-10-CM

## 2016-09-27 DIAGNOSIS — E785 Hyperlipidemia, unspecified: Secondary | ICD-10-CM

## 2016-09-27 DIAGNOSIS — K760 Fatty (change of) liver, not elsewhere classified: Secondary | ICD-10-CM | POA: Insufficient documentation

## 2016-09-27 MED ORDER — LISINOPRIL-HYDROCHLOROTHIAZIDE 10-12.5 MG PO TABS: 1.0000 | ORAL_TABLET | Freq: Every day | ORAL | 3 refills | Status: DC

## 2016-09-27 NOTE — Progress Notes (Signed)
Subjective:  Patient ID: Henry Garrett, male    DOB: [birth year]  Age: 50 y.o. MRN: [id number]  CC: Follow up  HPI:  50 year old male presents for follow-up regarding hypertension and hyperlipidemia.  HTN  Remains uncontrolled despite increase in HCTZ.  He is currently taking HCTZ 25 mg daily and amlodipine 5 mg daily.  He states that he's been on a higher dose of amlodipine in the past and developed lower extremity edema.  He has been on metoprolol on the past as well.  HLD  Uncontrolled.  Patient is not on treatment at this time. He elected to not proceed with treatment at last visit.  We will revisit today.  Social Hx   Social History   Social History  . Marital status: Married    Spouse name: N/A  . Number of children: N/A  . Years of education: N/A   Social History Main Topics  . Smoking status: Former Smoker    Quit date: 03/17/2000  . Smokeless tobacco: Never Used  . Alcohol use 12.0 oz/week    20 Cans of beer per week  . Drug use: No  . Sexual activity: Not Asked   Other Topics Concern  . None   Social History Narrative   Lives in [place]. From [place]. Married, 3 children 19,15,11.  2 dogs      Work - Charity fundraiser for [employer]  Constitutional: Negative.   Psychiatric/Behavioral: The patient is nervous/anxious.    Objective:  BP (!) 150/80 (BP Location: Left Arm, Patient Position: Sitting, Cuff Size: Large)   Pulse (!) 118   Temp 97.7 F (36.5 C) (Oral)   Resp 14   Wt 240 lb 8 oz (109.1 kg)   SpO2 99%   BMI 32.62 kg/m   BP/Weight 09/27/2016 05/02/7208 02/01/961  Systolic BP 836 629 476  Diastolic BP 80 92 89  Wt. (Lbs) 240.5 247 248.13  BMI 32.62 33.5 33.64    Physical Exam  Constitutional: He is oriented to person, place, and time. He appears well-developed. No distress.  Cardiovascular: Regular rhythm.   Tachycardic.  Pulmonary/Chest: Effort normal and breath sounds normal.  Neurological: He is alert and  oriented to person, place, and time.  Psychiatric: He has a normal mood and affect.  Vitals reviewed.  Lab Results  Component Value Date   WBC 6.9 06/28/2016   HGB 16.3 06/28/2016   HCT 47.2 06/28/2016   PLT 215.0 06/28/2016   GLUCOSE 133 (H) 07/19/2016   CHOL 259 (H) 06/28/2016   TRIG 96.0 06/28/2016   HDL 49.50 06/28/2016   LDLDIRECT 151.7 03/17/2013   LDLCALC 190 (H) 06/28/2016   ALT 58 (H) 07/19/2016   AST 41 (H) 07/19/2016   NA 137 07/19/2016   K 3.8 07/19/2016   CL 98 07/19/2016   CREATININE 1.08 07/19/2016   BUN 12 07/19/2016   CO2 29 07/19/2016   TSH 1.33 06/02/2015   HGBA1C 6.1 06/28/2016   MICROALBUR <0.7 06/02/2015    Assessment & Plan:   Problem List Items Addressed This Visit    Hyperlipidemia    Uncontrolled. ASCVD risk score 7%. He is a high-risk category due to LDL greater than or equal to 190. I recommended statin therapy. We discussed the risks and benefits. Patient elected to have repeat labs and then reassess.      Relevant Medications   lisinopril-hydrochlorothiazide (PRINZIDE,ZESTORETIC) 10-12.5 MG tablet   Other Relevant Orders   Lipid panel   Essential hypertension,  benign - Primary    Uncontrolled. Adding lisinopril today. Change to combination pill of lisinopril/HCTZ. f Follow-up labs in 7-10 days. Then 1 month follow up.      Relevant Medications   lisinopril-hydrochlorothiazide (PRINZIDE,ZESTORETIC) 10-12.5 MG tablet   Other Relevant Orders   Comp Met (CMET)      Meds ordered this encounter  Medications  . lisinopril-hydrochlorothiazide (PRINZIDE,ZESTORETIC) 10-12.5 MG tablet    Sig: Take 1 tablet by mouth daily.    Dispense:  90 tablet    Refill:  3    Follow-up: 1 month  [place]

## 2016-09-27 NOTE — Assessment & Plan Note (Signed)
Uncontrolled. ASCVD risk score 7%. He is a high-risk category due to LDL greater than or equal to 190. I recommended statin therapy. We discussed the risks and benefits. Patient elected to have repeat labs and then reassess.

## 2016-09-27 NOTE — Assessment & Plan Note (Signed)
Uncontrolled. Adding lisinopril today. Change to combination pill of lisinopril/HCTZ. f Follow-up labs in 7-10 days. Then 1 month follow up.

## 2016-09-27 NOTE — Patient Instructions (Signed)
Follow up in 7-10 days for labs.  Call with concerns.  Follow up in 1 month.  Take care  Dr. Lacinda Axon

## 2016-10-04 ENCOUNTER — Other Ambulatory Visit (INDEPENDENT_AMBULATORY_CARE_PROVIDER_SITE_OTHER): Payer: BLUE CROSS/BLUE SHIELD

## 2016-10-04 DIAGNOSIS — R945 Abnormal results of liver function studies: Principal | ICD-10-CM

## 2016-10-04 DIAGNOSIS — E785 Hyperlipidemia, unspecified: Secondary | ICD-10-CM

## 2016-10-04 DIAGNOSIS — I10 Essential (primary) hypertension: Secondary | ICD-10-CM | POA: Diagnosis not present

## 2016-10-04 DIAGNOSIS — R7989 Other specified abnormal findings of blood chemistry: Secondary | ICD-10-CM | POA: Diagnosis not present

## 2016-10-04 LAB — COMPREHENSIVE METABOLIC PANEL
ALBUMIN: 4.2 g/dL (ref 3.5–5.2)
ALT: 35 U/L (ref 0–53)
AST: 25 U/L (ref 0–37)
Alkaline Phosphatase: 74 U/L (ref 39–117)
BILIRUBIN TOTAL: 0.7 mg/dL (ref 0.2–1.2)
BUN: 10 mg/dL (ref 6–23)
CALCIUM: 9.6 mg/dL (ref 8.4–10.5)
CHLORIDE: 100 meq/L (ref 96–112)
CO2: 30 mEq/L (ref 19–32)
CREATININE: 0.95 mg/dL (ref 0.40–1.50)
GFR: 89.56 mL/min (ref >60.00)
Glucose, Bld: 134 mg/dL — ABNORMAL HIGH (ref 70–99)
Potassium: 4.4 mEq/L (ref 3.5–5.1)
Sodium: 138 mEq/L (ref 135–145)
Total Protein: 7.1 g/dL (ref 6.0–8.3)

## 2016-10-04 LAB — FERRITIN: Ferritin: 289.9 ng/mL (ref 22.0–322.0)

## 2016-10-04 LAB — LIPID PANEL
CHOL/HDL RATIO: 5
CHOLESTEROL: 246 mg/dL — AB (ref 0–200)
HDL: 46.3 mg/dL (ref >39.00)
LDL CALC: 172 mg/dL — AB (ref 0–99)
NonHDL: 199.82
TRIGLYCERIDES: 139 mg/dL (ref 0.0–149.0)
VLDL: 27.8 mg/dL (ref 0.0–40.0)

## 2016-10-05 LAB — IRON AND TIBC
%SAT: 38 % (ref 15–60)
Iron: 140 ug/dL (ref 50–180)
TIBC: 373 ug/dL (ref 250–425)
UIBC: 233 ug/dL (ref 125–400)

## 2016-10-05 LAB — HEPATITIS B SURFACE ANTIGEN: HEP B S AG: NEGATIVE

## 2016-10-05 LAB — HEPATITIS C ANTIBODY: HCV AB: NEGATIVE

## 2016-10-31 ENCOUNTER — Ambulatory Visit (INDEPENDENT_AMBULATORY_CARE_PROVIDER_SITE_OTHER): Payer: BLUE CROSS/BLUE SHIELD | Admitting: Family Medicine

## 2016-10-31 ENCOUNTER — Encounter: Payer: Self-pay | Admitting: Family Medicine

## 2016-10-31 DIAGNOSIS — I10 Essential (primary) hypertension: Secondary | ICD-10-CM

## 2016-10-31 NOTE — Assessment & Plan Note (Signed)
At goal. Continue current meds - Norvasc, Lisinopril/HCTZ. If sweating continues will discontinue lisinopril and increase norvasc.

## 2016-10-31 NOTE — Progress Notes (Signed)
Pre visit review using our clinic review tool, if applicable. No additional management support is needed unless otherwise documented below in the visit note. 

## 2016-10-31 NOTE — Patient Instructions (Signed)
Continue your meds.  If the sweating persists, we will switch (let me know in the next few weeks).  Follow up in 6 months.  Take care  Dr. Lacinda Axon

## 2016-10-31 NOTE — Progress Notes (Signed)
   Subjective:  Patient ID: Henry Garrett, male    DOB: 1966/03/11  Age: 51 y.o. MRN: [id number]  CC: Follow up HTN  HPI:  51 year old male With obesity, OSA, hyperkalemia, hypertension presents for follow-up regarding his hypertension.  HTN  Stable per report.  Compliant with meds.  Does report recent "sweating" (back of the neck). No true diaphoresis. No chest pain. No shortness of breath.  No other reported complaints/concerns.  He is currently on Norvasc 5, lisinopril/HCTZ 10/12.5.  Social Hx   Social History   Social History  . Marital status: Married    Spouse name: N/A  . Number of children: N/A  . Years of education: N/A   Social History Main Topics  . Smoking status: Former Smoker    Quit date: 03/17/2000  . Smokeless tobacco: Never Used  . Alcohol use 12.0 oz/week    20 Cans of beer per week  . Drug use: No  . Sexual activity: Not Asked   Other Topics Concern  . None   Social History Narrative   Lives in [place]. From [place]. Married, 3 children 19,15,11.  2 dogs      Work - Charity fundraiser for [employer]:       "Sweating".  Respiratory: Negative.   Cardiovascular: Negative.    Objective:  BP 131/81   Pulse (!) 111   Temp 97.6 F (36.4 C) (Oral)   Resp 14   Wt 243 lb 9.6 oz (110.5 kg)   SpO2 98%   BMI 33.04 kg/m   BP/Weight 10/31/2016 [id number]  Systolic BP [id number] [id number]  Diastolic BP 81 80 92  Wt. (Lbs) 243.6 240.5 247  BMI 33.04 32.62 33.5   Physical Exam  Constitutional: He is oriented to person, place, and time. He appears well-developed. No distress.  Cardiovascular: Regular rhythm.   Tachycardic.  Pulmonary/Chest: Effort normal and breath sounds normal.  Neurological: He is alert and oriented to person, place, and time.  Psychiatric: He has a normal mood and affect.  Vitals reviewed.  Lab Results  Component Value Date   WBC 6.9 06/28/2016   HGB 16.3 06/28/2016   HCT 47.2 06/28/2016    PLT 215.0 06/28/2016   GLUCOSE 134 (H) 10/04/2016   CHOL 246 (H) 10/04/2016   TRIG 139.0 10/04/2016   HDL 46.30 10/04/2016   LDLDIRECT 151.7 03/17/2013   LDLCALC 172 (H) 10/04/2016   ALT 35 10/04/2016   AST 25 10/04/2016   NA 138 10/04/2016   K 4.4 10/04/2016   CL 100 10/04/2016   CREATININE 0.95 10/04/2016   BUN 10 10/04/2016   CO2 30 10/04/2016   TSH 1.33 06/02/2015   HGBA1C 6.1 06/28/2016   MICROALBUR <0.7 06/02/2015    Assessment & Plan:   Problem List Items Addressed This Visit    Essential hypertension, benign    At goal. Continue current meds - Norvasc, Lisinopril/HCTZ. If sweating continues will discontinue lisinopril and increase norvasc.        Follow-up: 6 months  [place] DO [facility]

## 2017-05-05 ENCOUNTER — Ambulatory Visit (INDEPENDENT_AMBULATORY_CARE_PROVIDER_SITE_OTHER): Payer: BLUE CROSS/BLUE SHIELD | Admitting: Family Medicine

## 2017-05-05 ENCOUNTER — Encounter: Payer: Self-pay | Admitting: Family Medicine

## 2017-05-05 DIAGNOSIS — G4733 Obstructive sleep apnea (adult) (pediatric): Secondary | ICD-10-CM

## 2017-05-05 DIAGNOSIS — I10 Essential (primary) hypertension: Secondary | ICD-10-CM

## 2017-05-05 DIAGNOSIS — R7303 Prediabetes: Secondary | ICD-10-CM

## 2017-05-05 DIAGNOSIS — E785 Hyperlipidemia, unspecified: Secondary | ICD-10-CM

## 2017-05-05 DIAGNOSIS — Z130 Encounter for screening for diseases of the blood and blood-forming organs and certain disorders involving the immune mechanism: Secondary | ICD-10-CM | POA: Diagnosis not present

## 2017-05-05 MED ORDER — LISINOPRIL-HYDROCHLOROTHIAZIDE 20-12.5 MG PO TABS: 1.0000 | ORAL_TABLET | Freq: Every day | ORAL | 3 refills | Status: DC

## 2017-05-05 NOTE — Patient Instructions (Signed)
Lab in 7-10 days.  Follow up in 6 months. Check BP's regularly at home.  Take care  Dr. Lacinda Axon

## 2017-05-05 NOTE — Addendum Note (Signed)
Addended by: Leeanne Rio on: 05/05/2017 08:44 AM   Modules accepted: Orders

## 2017-05-05 NOTE — Assessment & Plan Note (Signed)
Not quite at goal. Increasing Lisinopril/HCTZ (20/12.5).

## 2017-05-05 NOTE — Progress Notes (Signed)
Subjective:  Patient ID: Henry Garrett, male    DOB: 1966/03/12  Age: 51 y.o. MRN: [id number]  CC: Follow up hypertension  HPI:  51 year old male with hypertension, OSA, hepatic steatosis, hyperlipidemia, obesity presents for follow up.  HTN  BP mildly elevated at home (599'J systolic).  Endorses compliance with amlodipine and lisinopril/HCTZ.  Patient states that he's had issues with lower extremity edema with increase in Norvasc previously.  HLD  Uncontrolled. Untreated. Patient needs lipid panel.  OSA  Stable on CPAP. Endorses compliance.  Social Hx   Social History   Social History  . Marital status: Married    Spouse name: N/A  . Number of children: N/A  . Years of education: N/A   Social History Main Topics  . Smoking status: Former Smoker    Quit date: 03/17/2000  . Smokeless tobacco: Never Used  . Alcohol use 12.0 oz/week    20 Cans of beer per week  . Drug use: No  . Sexual activity: Not Asked   Other Topics Concern  . None   Social History Narrative   Lives in [place]. From [place]. Married, 3 children 19,15,11.  2 dogs      Work - Charity fundraiser for [employer]  Respiratory: Negative.   Cardiovascular: Negative.    Objective:  BP (!) 150/84 (BP Location: Left Arm, Patient Position: Sitting, Cuff Size: Normal)   Pulse (!) 108   Temp 98.3 F (36.8 C) (Oral)   Wt 240 lb 4 oz (109 kg)   SpO2 98%   BMI 32.58 kg/m   BP/Weight 05/05/2017 10/31/2016 57/0/1779  Systolic BP 390 300 923  Diastolic BP 84 81 80  Wt. (Lbs) 240.25 243.6 240.5  BMI 32.58 33.04 32.62    Physical Exam  Constitutional: He is oriented to person, place, and time. He appears well-developed. No distress.  Cardiovascular: Normal rate and regular rhythm.   No murmur heard. Pulmonary/Chest: Effort normal and breath sounds normal. He has no wheezes. He has no rales.  Neurological: He is alert and oriented to person, place, and time.  Psychiatric: He has a  normal mood and affect.  Vitals reviewed.  Lab Results  Component Value Date   WBC 6.9 06/28/2016   HGB 16.3 06/28/2016   HCT 47.2 06/28/2016   PLT 215.0 06/28/2016   GLUCOSE 134 (H) 10/04/2016   CHOL 246 (H) 10/04/2016   TRIG 139.0 10/04/2016   HDL 46.30 10/04/2016   LDLDIRECT 151.7 03/17/2013   LDLCALC 172 (H) 10/04/2016   ALT 35 10/04/2016   AST 25 10/04/2016   NA 138 10/04/2016   K 4.4 10/04/2016   CL 100 10/04/2016   CREATININE 0.95 10/04/2016   BUN 10 10/04/2016   CO2 30 10/04/2016   TSH 1.33 06/02/2015   HGBA1C 6.1 06/28/2016   MICROALBUR <0.7 06/02/2015    Assessment & Plan:   Problem List Items Addressed This Visit      Cardiovascular and Mediastinum   Essential hypertension, benign    Not quite at goal. Increasing Lisinopril/HCTZ (20/12.5).      Relevant Medications   lisinopril-hydrochlorothiazide (PRINZIDE,ZESTORETIC) 20-12.5 MG tablet   Other Relevant Orders   Comprehensive metabolic panel     Respiratory   OSA (obstructive sleep apnea)    Stable. Continue compliance with CPAP.        Other   Hyperlipidemia    Will repeat lipid panel. Patient to consider statin therapy.      Relevant Medications  lisinopril-hydrochlorothiazide (PRINZIDE,ZESTORETIC) 20-12.5 MG tablet   Other Relevant Orders   Lipid panel    Other Visit Diagnoses    Prediabetes       Relevant Orders   Hemoglobin A1c   Screening for deficiency anemia       Relevant Orders   CBC      Meds ordered this encounter  Medications  . lisinopril-hydrochlorothiazide (PRINZIDE,ZESTORETIC) 20-12.5 MG tablet    Sig: Take 1 tablet by mouth daily.    Dispense:  90 tablet    Refill:  3   Follow-up: 6 months  Little Flock DO [facility]

## 2017-05-05 NOTE — Assessment & Plan Note (Signed)
Will repeat lipid panel. Patient to consider statin therapy.

## 2017-05-05 NOTE — Assessment & Plan Note (Signed)
Stable. Continue compliance with CPAP.

## 2017-05-12 ENCOUNTER — Other Ambulatory Visit (INDEPENDENT_AMBULATORY_CARE_PROVIDER_SITE_OTHER): Payer: BLUE CROSS/BLUE SHIELD

## 2017-05-12 DIAGNOSIS — E785 Hyperlipidemia, unspecified: Secondary | ICD-10-CM | POA: Diagnosis not present

## 2017-05-12 DIAGNOSIS — Z130 Encounter for screening for diseases of the blood and blood-forming organs and certain disorders involving the immune mechanism: Secondary | ICD-10-CM | POA: Diagnosis not present

## 2017-05-12 DIAGNOSIS — R7303 Prediabetes: Secondary | ICD-10-CM

## 2017-05-12 DIAGNOSIS — I10 Essential (primary) hypertension: Secondary | ICD-10-CM | POA: Diagnosis not present

## 2017-05-12 LAB — LIPID PANEL
CHOLESTEROL: 203 mg/dL — AB (ref 0–200)
HDL: 37.6 mg/dL — AB (ref >39.00)
LDL CALC: 141 mg/dL — AB (ref 0–99)
NonHDL: 165.64
TRIGLYCERIDES: 123 mg/dL (ref 0.0–149.0)
Total CHOL/HDL Ratio: 5
VLDL: 24.6 mg/dL (ref 0.0–40.0)

## 2017-05-12 LAB — COMPREHENSIVE METABOLIC PANEL
ALBUMIN: 4.2 g/dL (ref 3.5–5.2)
ALT: 28 U/L (ref 0–53)
AST: 22 U/L (ref 0–37)
Alkaline Phosphatase: 53 U/L (ref 39–117)
BUN: 12 mg/dL (ref 6–23)
CALCIUM: 9.6 mg/dL (ref 8.4–10.5)
CHLORIDE: 100 meq/L (ref 96–112)
CO2: 28 meq/L (ref 19–32)
Creatinine, Ser: 1 mg/dL (ref 0.40–1.50)
GFR: 84.2 mL/min (ref >60.00)
Glucose, Bld: 121 mg/dL — ABNORMAL HIGH (ref 70–99)
POTASSIUM: 4.1 meq/L (ref 3.5–5.1)
Sodium: 136 mEq/L (ref 135–145)
Total Bilirubin: 0.6 mg/dL (ref 0.2–1.2)
Total Protein: 7.1 g/dL (ref 6.0–8.3)

## 2017-05-12 LAB — CBC
HEMATOCRIT: 41.7 % (ref 39.0–52.0)
Hemoglobin: 14.4 g/dL (ref 13.0–17.0)
MCHC: 34.6 g/dL (ref 30.0–36.0)
MCV: 92.3 fl (ref 78.0–100.0)
PLATELETS: 257 10*3/uL (ref 150.0–400.0)
RBC: 4.52 Mil/uL (ref 4.22–5.81)
RDW: 13 % (ref 11.5–15.5)
WBC: 7.8 10*3/uL (ref 4.0–10.5)

## 2017-05-12 LAB — HEMOGLOBIN A1C: HEMOGLOBIN A1C: 6.3 % (ref 4.6–6.5)

## 2017-05-12 NOTE — Addendum Note (Signed)
Addended by: Leeanne Rio on: 05/12/2017 10:19 AM   Modules accepted: Orders

## 2017-05-13 ENCOUNTER — Encounter: Payer: Self-pay | Admitting: *Deleted

## 2017-06-19 ENCOUNTER — Other Ambulatory Visit: Payer: Self-pay | Admitting: Family Medicine

## 2017-09-01 ENCOUNTER — Telehealth: Payer: Self-pay

## 2017-09-01 NOTE — Telephone Encounter (Signed)
Note reviewed.  There are several residential and outpatient treatment programs in [place].  [facility] is 1 of them.  I would certainly recommend that he undergo detox either in an inpatient setting or outpatient setting given his history.  He certainly should not stop drinking all of a sudden as I would place him at risk for withdrawal.  If he does start to undergo withdrawal symptoms he should be evaluated in the emergency room.

## 2017-09-01 NOTE — Telephone Encounter (Signed)
Unfortunately with the schedule this week unless somebody cancels I do not have any availability.  [facility] may have someone that would this evaluation.  I would not necessarily recommend Prozac for anxiety as this may be activating.

## 2017-09-01 NOTE — Telephone Encounter (Signed)
Spoke with patients wife again and she took patient to ED but they did not keep him. They have checked into an outpatient program called [facility]. Patients wife was wondering if you could start him on something for anxiety. NO narcotics but she suggested prozac. Is there any way we can work them in before you are out of office this week.

## 2017-09-01 NOTE — Telephone Encounter (Signed)
Spoke with patients wife. Patient was in [facility] for rehab for about 22 days. He came home on OCT 18 and was sober until last Thursday. Patient stayed intoxicated for days straight and is still intoxicated at this time. Patients wife is wondering what the next step should be or if there was anything that you recommended. Advised patient of signs of withdrawal and told her to take patient to the ED if he presents with any of the symptoms. Patients wife agreed to comply and also agreed to office visit to talk with you about what they should do. Please advise.

## 2017-09-02 NOTE — Telephone Encounter (Signed)
Patient is going to call back if they need referral. [facility] are waiting for [facility] to call them back right now.

## 2017-09-02 NOTE — Telephone Encounter (Signed)
Noted  

## 2017-11-07 ENCOUNTER — Other Ambulatory Visit: Payer: Self-pay

## 2017-11-07 ENCOUNTER — Encounter: Payer: Self-pay | Admitting: Family Medicine

## 2017-11-07 ENCOUNTER — Ambulatory Visit: Payer: BLUE CROSS/BLUE SHIELD | Admitting: Family Medicine

## 2017-11-07 DIAGNOSIS — F1021 Alcohol dependence, in remission: Secondary | ICD-10-CM | POA: Diagnosis not present

## 2017-11-07 DIAGNOSIS — E785 Hyperlipidemia, unspecified: Secondary | ICD-10-CM

## 2017-11-07 DIAGNOSIS — I10 Essential (primary) hypertension: Secondary | ICD-10-CM

## 2017-11-07 DIAGNOSIS — G4733 Obstructive sleep apnea (adult) (pediatric): Secondary | ICD-10-CM | POA: Diagnosis not present

## 2017-11-07 NOTE — Assessment & Plan Note (Signed)
Well-controlled.  Labs reviewed from [facility].  They will be scanned into the system.  Continue current regimen

## 2017-11-07 NOTE — Assessment & Plan Note (Signed)
Patient reports he has been sober since going to [facility] in November.  He will continue Alcoholics Anonymous.  Offered support.

## 2017-11-07 NOTE — Patient Instructions (Signed)
Nice to meet you. Please start working on exercise. Please try 3 days a week to start with.  We will see you back in 6 months. If you need anything please let us know.

## 2017-11-07 NOTE — Assessment & Plan Note (Signed)
Improved.  He will continue CPAP.

## 2017-11-07 NOTE — Assessment & Plan Note (Signed)
LDL cholesterol has been improving.  Most recent was 113.  He will continue diet and exercise.

## 2017-11-07 NOTE — Progress Notes (Signed)
  Tommi Rumps, MD Phone: [phone]  Kollin Udell is a 52 y.o. male who presents today for follow-up.  Hypertension: Typically 116/68.  Highest is similar to today.  Taking amlodipine, HCTZ, lisinopril.  No chest pain, shortness of breath, or edema.  Hyperlipidemia: Has trended down.  He brought lab work and for [place] to review.  He has cut out carbs and is working on portion control.  He stays active and plans to start exercising 3-4 days a week.  He reports he is down about 45 pounds over the last year.  OSA: Uses a CPAP nightly.  Does wake up well rested.  No hypersomnia.  Patient reports he went through an alcohol treatment program in November at [facility].  He has been sober since then.  He is going to Alcoholics Anonymous.  He was drinking 20 alcoholic beverages a day.  Social History   Tobacco Use  Smoking Status Former Smoker  . Last attempt to quit: 03/17/2000  . Years since quitting: 17.6  Smokeless Tobacco Never Used     ROS see history of present illness  Objective  Physical Exam Vitals:   11/07/17 0800  BP: 120/82  Pulse: 96  Temp: 98.2 F (36.8 C)  SpO2: 98%    BP Readings from Last 3 Encounters:  11/07/17 120/82  05/05/17 (!) 150/84  10/31/16 131/81   Wt Readings from Last 3 Encounters:  11/07/17 211 lb (95.7 kg)  05/05/17 240 lb 4 oz (109 kg)  10/31/16 243 lb 9.6 oz (110.5 kg)    Physical Exam  Constitutional: No distress.  Cardiovascular: Normal rate, regular rhythm and normal heart sounds.  Pulmonary/Chest: Effort normal and breath sounds normal.  Musculoskeletal: He exhibits no edema.  Neurological: He is alert. Gait normal.  Skin: Skin is warm and dry. He is not diaphoretic.     Assessment/Plan: Please see individual problem list.  OSA (obstructive sleep apnea) Improved.  He will continue CPAP.  Essential hypertension, benign Well-controlled.  [facility].  They will be scanned into the system.   Continue current regimen  Hyperlipidemia LDL cholesterol has been improving.  Most recent was 113.  He will continue diet and exercise.  History of alcoholism [facility]) Patient reports he has been sober since going to [facility] in November.  He will continue Alcoholics Anonymous.  Offered support.   Jovann was seen today for establish care.  Diagnoses and all orders for this visit:  OSA (obstructive sleep apnea)  Essential hypertension, benign  Hyperlipidemia, unspecified hyperlipidemia type  History of alcoholism ([place])    No orders of the defined types were placed in this encounter.   No orders of the defined types were placed in this encounter.    Tommi Rumps, MD [facility]

## 2017-11-21 ENCOUNTER — Encounter: Payer: Self-pay | Admitting: Family Medicine

## 2017-11-21 NOTE — Telephone Encounter (Signed)
I have updated the pharmacy, last OV 11/07/17 last filled by Dr.Cook

## 2017-11-22 MED ORDER — AMLODIPINE BESYLATE 5 MG PO TABS: 5.0000 mg | ORAL_TABLET | Freq: Every day | ORAL | 3 refills | Status: DC

## 2017-11-22 MED ORDER — LISINOPRIL-HYDROCHLOROTHIAZIDE 20-12.5 MG PO TABS: 1.0000 | ORAL_TABLET | Freq: Every day | ORAL | 3 refills | Status: DC

## 2018-05-08 ENCOUNTER — Ambulatory Visit: Payer: BLUE CROSS/BLUE SHIELD | Admitting: Family Medicine

## 2018-06-01 ENCOUNTER — Ambulatory Visit: Payer: BLUE CROSS/BLUE SHIELD | Admitting: Family Medicine

## 2018-06-01 ENCOUNTER — Encounter: Payer: Self-pay | Admitting: Family Medicine

## 2018-06-01 VITALS — BP 160/100 | HR 79 | Temp 97.7°F | Ht 72.0 in | Wt 211.8 lb

## 2018-06-01 DIAGNOSIS — Z1211 Encounter for screening for malignant neoplasm of colon: Secondary | ICD-10-CM | POA: Diagnosis not present

## 2018-06-01 DIAGNOSIS — L819 Disorder of pigmentation, unspecified: Secondary | ICD-10-CM

## 2018-06-01 DIAGNOSIS — Z1212 Encounter for screening for malignant neoplasm of rectum: Secondary | ICD-10-CM

## 2018-06-01 DIAGNOSIS — F1021 Alcohol dependence, in remission: Secondary | ICD-10-CM

## 2018-06-01 DIAGNOSIS — R251 Tremor, unspecified: Secondary | ICD-10-CM

## 2018-06-01 DIAGNOSIS — G4733 Obstructive sleep apnea (adult) (pediatric): Secondary | ICD-10-CM

## 2018-06-01 DIAGNOSIS — I10 Essential (primary) hypertension: Secondary | ICD-10-CM

## 2018-06-01 DIAGNOSIS — R200 Anesthesia of skin: Secondary | ICD-10-CM

## 2018-06-01 DIAGNOSIS — E785 Hyperlipidemia, unspecified: Secondary | ICD-10-CM

## 2018-06-01 DIAGNOSIS — R2232 Localized swelling, mass and lump, left upper limb: Secondary | ICD-10-CM

## 2018-06-01 LAB — LIPID PANEL
CHOL/HDL RATIO: 5
Cholesterol: 177 mg/dL (ref 0–200)
HDL: 38.6 mg/dL — ABNORMAL LOW (ref >39.00)
LDL CALC: 125 mg/dL — AB (ref 0–99)
NONHDL: 138.44
Triglycerides: 69 mg/dL (ref 0.0–149.0)
VLDL: 13.8 mg/dL (ref 0.0–40.0)

## 2018-06-01 LAB — COMPREHENSIVE METABOLIC PANEL
ALT: 19 U/L (ref 0–53)
AST: 13 U/L (ref 0–37)
Albumin: 4.5 g/dL (ref 3.5–5.2)
Alkaline Phosphatase: 58 U/L (ref 39–117)
BUN: 12 mg/dL (ref 6–23)
CO2: 28 meq/L (ref 19–32)
Calcium: 9.6 mg/dL (ref 8.4–10.5)
Chloride: 105 mEq/L (ref 96–112)
Creatinine, Ser: 0.96 mg/dL (ref 0.40–1.50)
GFR: 87.89 mL/min (ref >60.00)
GLUCOSE: 106 mg/dL — AB (ref 70–99)
POTASSIUM: 4 meq/L (ref 3.5–5.1)
SODIUM: 140 meq/L (ref 135–145)
TOTAL PROTEIN: 7.2 g/dL (ref 6.0–8.3)
Total Bilirubin: 0.6 mg/dL (ref 0.2–1.2)

## 2018-06-01 LAB — VITAMIN B12: VITAMIN B 12: 269 pg/mL (ref 211–911)

## 2018-06-01 LAB — HEMOGLOBIN A1C: Hgb A1c MFr Bld: 5.7 % (ref 4.6–6.5)

## 2018-06-01 NOTE — Assessment & Plan Note (Signed)
Well-controlled at home though elevated here.  We will have him return sometime in the next week or so for BP check with nursing and he will bring in his cuff.  If his cuff seems to be accurate he will not need medication.

## 2018-06-01 NOTE — Assessment & Plan Note (Signed)
Patient reports today is his 43-month anniversary of being sober.  I congratulated him on this.

## 2018-06-01 NOTE — Progress Notes (Signed)
Tommi Rumps, MD Phone: [phone]  Henry Garrett is a 52 y.o. male who presents today for f/u.  CC: htn, OSA, finger and toe numbness, skin lesions, nodule left ring finger  HYPERTENSION  Disease Monitoring  Home BP Monitoring patient noted he was dropping down to 100/50 with his medications.  He discontinued medications about 6 weeks ago and has been running 120/70 at home chest pain-no    dyspnea-no Medications  Compliance-no medications. Lightheadedness-he was getting lightheaded though not since he discontinued his medications edema-no  OSA: Using his CPAP most nights.  He does not take it when he travels.  No hypersomnia.  He does wake well rested.  It has been greater than 5 years since he was fit for this.  Has an AutoPap.  Reports his bilateral thumb and index fingertips feel cold temperature wise and have some numbness.  He has similar sensation in his fibular aspect small toes and tibial aspect great toe.  Notes this was slow onset.  Has gotten somewhat worse.  Last glucose was 95.  He reports a nodule over the proximal phalanx of his left ring finger.  Started off very small and grew over the last year though has been stable for months.  Sometimes it is tender.  He has had some hypopigmented skin lesions on his left face for the last 6 months.  Resting tremor: Patient notes he has had this chronically since he was younger.  It has actually gotten better since he quit drinking alcohol.  He has multiple family members with mild tremor.  Social History   Tobacco Use  Smoking Status Former Smoker  . Last attempt to quit: 03/17/2000  . Years since quitting: 18.2  Smokeless Tobacco Never Used     ROS see history of present illness  Objective  Physical Exam Vitals:   06/01/18 0955  BP: (!) 160/100  Pulse: 79  Temp: 97.7 F (36.5 C)  SpO2: 98%    BP Readings from Last 3 Encounters:  06/01/18 (!) 160/100  11/07/17 120/82  05/05/17 (!) 150/84   Wt  Readings from Last 3 Encounters:  06/01/18 211 lb 12.8 oz (96.1 kg)  11/07/17 211 lb (95.7 kg)  05/05/17 240 lb 4 oz (109 kg)    Physical Exam  Constitutional: No distress.  Cardiovascular: Normal rate, regular rhythm and normal heart sounds.  Pulmonary/Chest: Effort normal and breath sounds normal.  Musculoskeletal: He exhibits no edema.  Left lateral aspect ring finger proximal phalanx with nodule noted that is nontender, it is freely mobile, it is over the palmar surface  Neurological: He is alert.  CN 2-12 intact, 5/5 strength in bilateral biceps, triceps, grip, quads, hamstrings, plantar and dorsiflexion, minimal decreased light touch sensation in the tips of bilateral thumbs and index fingers as well as the tibial aspect of the bilateral great toes and the fibular aspect of bilateral small toes, sensation to light touch intact in bilateral UE and LE, normal gait, slight resting tremor noted in hands and head  Skin: Skin is warm and dry. He is not diaphoretic.     Assessment/Plan: Please see individual problem list.  Essential hypertension, benign Well-controlled at home though elevated here.  We will have him return sometime in the next week or so for BP check with nursing and he will bring in his cuff.  If his cuff seems to be accurate he will not need medication.  OSA (obstructive sleep apnea) Continue CPAP.  History of alcoholism [facility]) Patient reports today is  his 62-monthanniversary of being sober.  I congratulated him on this.  Nodule of finger of left hand   Undetermined cause.  Refer to sports medicine to consider ultrasound.  Numbness of fingers Decreased sensation as outlined in the exam.  Will obtain B12 and A1c.  Consider neurology referral if labs are unremarkable.  Hypopigmented skin lesion Refer to dermatology for evaluation.  Tremor Potentially benign tremor particularly given family history.  Continue to monitor at this time.   Health Maintenance:  Patient deferred colonoscopy though opted for Cologuard.  He will check with his insurance to make sure this is covered.  I did advise that if he does not provide a sample he would not be billed for it.  Orders Placed This Encounter  Procedures  . Cologuard  . Comp Met (CMET)  . Lipid panel  . B12  . HgB A1c  . Ambulatory referral to Dermatology    Referral Priority:   Routine    Referral Type:   Consultation    Referral Reason:   Specialty Services Required    Requested Specialty:   Dermatology    Number of Visits Requested:   1  . Ambulatory referral to Sports Medicine    Referral Priority:   Routine    Referral Type:   Consultation    Number of Visits Requested:   1    No orders of the defined types were placed in this encounter.    ETommi Rumps MD [facility]

## 2018-06-01 NOTE — Assessment & Plan Note (Signed)
Potentially benign tremor particularly given family history.  Continue to monitor at this time.

## 2018-06-01 NOTE — Assessment & Plan Note (Signed)
Refer to dermatology for evaluation. 

## 2018-06-01 NOTE — Assessment & Plan Note (Signed)
Decreased sensation as outlined in the exam.  Will obtain B12 and A1c.  Consider neurology referral if labs are unremarkable.

## 2018-06-01 NOTE — Patient Instructions (Signed)
Nice to see you. We will have you return later this week or early next week with your blood pressure cuff so we can compare it to our readings. We will check lab work today and contact you with the results. I have referred you to dermatology and sports medicine.

## 2018-06-01 NOTE — Assessment & Plan Note (Signed)
Undetermined cause.  Refer to sports medicine to consider ultrasound.

## 2018-06-01 NOTE — Assessment & Plan Note (Signed)
Continue CPAP.  

## 2018-06-06 ENCOUNTER — Other Ambulatory Visit: Payer: Self-pay | Admitting: Family Medicine

## 2018-06-06 DIAGNOSIS — E538 Deficiency of other specified B group vitamins: Secondary | ICD-10-CM

## 2018-06-06 DIAGNOSIS — R200 Anesthesia of skin: Secondary | ICD-10-CM

## 2018-06-09 ENCOUNTER — Ambulatory Visit (INDEPENDENT_AMBULATORY_CARE_PROVIDER_SITE_OTHER): Payer: BLUE CROSS/BLUE SHIELD

## 2018-06-09 ENCOUNTER — Other Ambulatory Visit (INDEPENDENT_AMBULATORY_CARE_PROVIDER_SITE_OTHER): Payer: BLUE CROSS/BLUE SHIELD

## 2018-06-09 DIAGNOSIS — R030 Elevated blood-pressure reading, without diagnosis of hypertension: Secondary | ICD-10-CM | POA: Diagnosis not present

## 2018-06-09 DIAGNOSIS — E538 Deficiency of other specified B group vitamins: Secondary | ICD-10-CM

## 2018-06-09 NOTE — Progress Notes (Signed)
Patient here today for Blood pressure check. Blood pressure was checked manually and with patients Blood pressure cuff  Manual: Left arm: 128/88              P: 82             O2: 98 Right arm: 132/84                 P: 84               O2: 98  Patients cuff: Left arm: 134/87              P: 82 Right arm: 141/77                P: 84

## 2018-06-10 NOTE — Progress Notes (Signed)
Patient's home cuff measure slightly higher than manual check here.  He should continue to monitor at home given that it has been well controlled at home recently.  If it starts to elevated at home he should let us know.

## 2018-06-12 ENCOUNTER — Encounter: Payer: Self-pay | Admitting: Sports Medicine

## 2018-06-12 ENCOUNTER — Ambulatory Visit: Payer: BLUE CROSS/BLUE SHIELD | Admitting: Sports Medicine

## 2018-06-12 VITALS — BP 132/84 | Ht 73.0 in | Wt 207.0 lb

## 2018-06-12 DIAGNOSIS — M67442 Ganglion, left hand: Secondary | ICD-10-CM | POA: Diagnosis not present

## 2018-06-12 NOTE — Assessment & Plan Note (Signed)
Today we performed an ultrasound of your left finger.  It demonstrated a 0.59 x 0.49 x 0.91 cm ganglion cyst which is on the ulnar aspect of the flexor tendon sheath of the fourth digit at the P1 segment.  It does appear to be fluid-filled and gelatinous.  We discussed treatment for this.  This could include surveillance which is a conservative approach.  If at any point it starts to grow larger, become more painful, or you develop numbness/tingling/restricted range of motion, then I am recommending further intervention and a follow-up visit.  This further and intervention would include aspiration and/or referral for surgical excision.  Patient would like to treat this conservatively at first with just monitoring it.  May take anti-inflammatories as needed for pain or discomfort.  He may also benefit from diclofenac gel over the area in the future if it is bothersome.

## 2018-06-12 NOTE — Progress Notes (Signed)
CC: L 4th finger nodule  Henry Garrett is a 52 year old male who is otherwise healthy who presents today with a 1 to 2-year history of a nodule on his left fourth finger.  He states he originally recognized it when he was trying to take off his wedding ring.  He noticed a mild prominence and a pebble in the volar aspect of his finger - P1 segment.  He feels like it may be getting slightly larger in the past couple weeks.  He states it does ache and occasionally has a dull pain. Nothing seems to make his pain improve.  Constant usage does exacerbate his symptoms including gripping things.  Today it is not painful.  He denies any significant numbness or tingling.  He denies any locking or popping of the finger.  Denies any restriction in flexion or extension of the finger.  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  He is right-handed.   Previous Interventions Tried: None  Past Medical Hx: Hypertension, obstructive sleep apnea, fatty liver disease, benign essential tremor Past Surgeries: Hernia repair, tonsillectomy, adenoidectomy, myringotomy Smoking: Former smoker who quit in 2001 Family Hx: Brother has coronary artery disease, mother has arthritis  ROS: Per HPI; in addition no fever, no rash, no weakness.  No lacerations, ecchymoses or erythema of the skin.  Objective: BP 132/84   Ht 6\' 1"  (1.854 m)   Wt 207 lb (93.9 kg)   BMI 27.31 kg/m  Gen: Right-Hand Dominant. NAD, well groomed, a/o x3, normal affect.  CV: Well-perfused. Warm.  Resp: Non-labored.  No audible wheezes. Neuro: Sensation intact throughout. No gross coordination deficits.  MSK: Inspection of the left hand demonstrates no obvious abnormality, swelling or ecchymoses.  There is a palpable nodule in the P1 segment of the left fourth digit just distal to the MCP joint.  It does appear to move with flexion of the flexor tendon however is  on the ulnar aspect of this.  It is firm to palpation.  He has full range of motion finger flexion extension.  Strength testing is 5 out of 5 in flexion and extension.  He has a capillary refill time less than 2 seconds.  A positive Allen's test.  Sensation is intact to light touch C5-T1.  He is able to make a complete fist and extend his digits without any locking or popping.  Limited diagnostic ultrasound performed today: It demonstrates a 0.59 x 0.49 x 0.91 cm circular cyst on the ulnar aspect of the flexor tendon sheath of the fourth digit.  It does appear to move with flexion of the finger.  It appears fluid filled with gelatinous material in it.  The MCP and PIP joints were also visualized and did not show significant arthritis.  Assessment and Plan:  Ganglion cyst of flexor tendon sheath of finger of left hand Today we performed an ultrasound of your left finger.  It demonstrated a 0.59 x 0.49 x 0.91 cm ganglion cyst which is on the ulnar aspect of the flexor tendon sheath of the fourth digit at the P1 segment.  It does appear to be fluid-filled and gelatinous.  We discussed treatment for this.  This could include surveillance which is a conservative approach.  If at any point it starts to grow larger, become more painful, or you develop numbness/tingling/restricted range of motion, then I am recommending further intervention and a follow-up  visit.  This further and intervention would include aspiration and/or referral for surgical excision.  Patient would like to treat this conservatively at first with just monitoring it.  May take anti-inflammatories as needed for pain or discomfort.  He may also benefit from diclofenac gel over the area in the future if it is bothersome.  Follow-up will be as needed.   [place] [facility] Fellow 06/12/2018 10:55 AM  I agree with the above plan of care.

## 2018-06-12 NOTE — Patient Instructions (Signed)
We saw your Left 4th finger today and performed an ultrasound. I believe you have a ganglion cyst on your finger at the P1 segment. We measured it today at 0.49cm by 0.59cm. We have elected to watch this. If it grows larger, becomes more painful, or you develop restricted motion/numbness/tingling, than I am recommend reevaluation for possible aspiration and/or excision via hand surgery. Follow up as needed.

## 2018-06-13 LAB — METHYLMALONIC ACID, SERUM: METHYLMALONIC ACID, QUANT: 89 nmol/L (ref 87–318)

## 2018-06-13 LAB — HOMOCYSTEINE: Homocysteine: 11.4 umol/L — ABNORMAL HIGH (ref <11.4)

## 2018-08-06 DIAGNOSIS — I788 Other diseases of capillaries: Secondary | ICD-10-CM | POA: Diagnosis not present

## 2018-08-06 DIAGNOSIS — R21 Rash and other nonspecific skin eruption: Secondary | ICD-10-CM | POA: Diagnosis not present

## 2018-08-06 DIAGNOSIS — Z872 Personal history of diseases of the skin and subcutaneous tissue: Secondary | ICD-10-CM | POA: Diagnosis not present

## 2018-08-06 DIAGNOSIS — L578 Other skin changes due to chronic exposure to nonionizing radiation: Secondary | ICD-10-CM | POA: Diagnosis not present

## 2018-10-02 ENCOUNTER — Ambulatory Visit: Payer: BLUE CROSS/BLUE SHIELD | Admitting: Family Medicine

## 2018-10-02 ENCOUNTER — Encounter: Payer: Self-pay | Admitting: Family Medicine

## 2018-10-02 ENCOUNTER — Ambulatory Visit (INDEPENDENT_AMBULATORY_CARE_PROVIDER_SITE_OTHER): Payer: BLUE CROSS/BLUE SHIELD

## 2018-10-02 VITALS — BP 142/78 | HR 83 | Temp 98.2°F | Resp 16 | Ht 73.0 in | Wt 225.8 lb

## 2018-10-02 DIAGNOSIS — I10 Essential (primary) hypertension: Secondary | ICD-10-CM | POA: Diagnosis not present

## 2018-10-02 DIAGNOSIS — R200 Anesthesia of skin: Secondary | ICD-10-CM

## 2018-10-02 DIAGNOSIS — Z23 Encounter for immunization: Secondary | ICD-10-CM

## 2018-10-02 DIAGNOSIS — M47812 Spondylosis without myelopathy or radiculopathy, cervical region: Secondary | ICD-10-CM | POA: Diagnosis not present

## 2018-10-02 DIAGNOSIS — E785 Hyperlipidemia, unspecified: Secondary | ICD-10-CM | POA: Diagnosis not present

## 2018-10-02 NOTE — Assessment & Plan Note (Signed)
Well-controlled at home.  Continue to monitor.  If trends up above 140/90 he will let us know.

## 2018-10-02 NOTE — Assessment & Plan Note (Signed)
Discussed the importance of diet and exercise.  He notes he will start this after the first of the year.

## 2018-10-02 NOTE — Patient Instructions (Signed)
Nice to see you. We will refer you to neurology and get a neck x-ray today. Please start to work on diet and exercise. Please continue to monitor your blood pressure. If it starts to trend up over 140/90 please let us know. Please check with your insurance company regarding colonoscopy versus Cologuard and whether or not a colonoscopy would be covered after a positive Cologuard test.

## 2018-10-02 NOTE — Assessment & Plan Note (Signed)
This continues to be an issue.  He notes he was not contacted by neurology previously when the referral was placed.  We will refer him again.  Will obtain an x-ray of his neck to evaluate for a cervical cause.  He is given return precautions.

## 2018-10-02 NOTE — Progress Notes (Signed)
Tommi Rumps, MD Phone: [phone]  Henry Garrett is a 52 y.o. male who presents today for follow-up.  CC: Hypertension, finger and toe numbness, hyperlipidemia  Hypertension: Typically 130s/60s at home.  No chest pain, shortness of breath, or edema.  He is not on medication.  Bilateral finger and toe numbness: Patient notes bilateral index finger and thumb numbness as well as bilateral toe numbness in the pads of his toes that is worse than the dorsum of his toes.  He notes occasionally there will be some discomfort if he reaches with his hand and the discomfort will be in his index fingers.  He notes no burning or tingling.  No weakness.  No neck pain.  No back pain.  No other symptoms with this.  It is slightly worse than it was when I saw him previously.  Hyperlipidemia: He is not working on diet or exercise currently.  He plans to start after the first of the year.  Social History   Tobacco Use  Smoking Status Former Smoker  . Last attempt to quit: 03/17/2000  . Years since quitting: 18.5  Smokeless Tobacco Never Used     ROS see history of present illness  Objective  Physical Exam Vitals:   10/02/18 1423 10/02/18 1429  BP: (!) 156/76 (!) 142/78  Pulse: 83   Resp: 16   Temp: 98.2 F (36.8 C)   SpO2: 98%     BP Readings from Last 3 Encounters:  10/02/18 (!) 142/78  06/12/18 132/84  06/01/18 (!) 160/100   Wt Readings from Last 3 Encounters:  10/02/18 225 lb 12.8 oz (102.4 kg)  06/12/18 207 lb (93.9 kg)  06/01/18 211 lb 12.8 oz (96.1 kg)    Physical Exam  Constitutional: No distress.  Cardiovascular: Normal rate, regular rhythm and normal heart sounds.  Pulmonary/Chest: Effort normal and breath sounds normal.  Musculoskeletal: He exhibits no edema.  Neurological: He is alert.  CN 2-12 intact, 5/5 strength in bilateral biceps, triceps, grip, quads, hamstrings, plantar and dorsiflexion, slight decrease light touch sensation in bilateral thumb and  index finger worse at the tips of his fingers, also slight decreased light touch sensation in bilateral toes worse on the plantar surface of the toes, otherwise sensation to light touch intact in bilateral UE and LE, normal gait  Skin: Skin is warm and dry. He is not diaphoretic.     Assessment/Plan: Please see individual problem list.  Essential hypertension, benign Well-controlled at home.  Continue to monitor.  If trends up above 140/90 he will let us know.  Numbness of fingers This continues to be an issue.  He notes he was not contacted by neurology previously when the referral was placed.  We will refer him again.  Will obtain an x-ray of his neck to evaluate for a cervical cause.  He is given return precautions.  Hyperlipidemia Discussed the importance of diet and exercise.  He notes he will start this after the first of the year.   Orders Placed This Encounter  Procedures  . DG Cervical Spine Complete    Standing Status:   Future    Number of Occurrences:   1    Standing Expiration Date:   12/04/2019    Order Specific Question:   Reason for Exam (SYMPTOM  OR DIAGNOSIS REQUIRED)    Answer:   numbess bilateral thumb and index finger as well as bilateral toes    Order Specific Question:   Preferred imaging location?    Answer:  [facility] Specific Question:   Radiology Contrast Protocol - do NOT remove file path    Answer:   [email or web address].pdf  . Flu Vaccine QUAD 6+ mos PF IM (Fluarix Quad PF)  . Ambulatory referral to Neurology    Referral Priority:   Routine    Referral Type:   Consultation    Referral Reason:   Specialty Services Required    Requested Specialty:   Neurology    Number of Visits Requested:   1    No orders of the defined types were placed in this encounter.    Tommi Rumps, MD [place]

## 2018-10-09 ENCOUNTER — Other Ambulatory Visit: Payer: Self-pay | Admitting: Family Medicine

## 2018-10-09 DIAGNOSIS — M5412 Radiculopathy, cervical region: Secondary | ICD-10-CM

## 2018-10-13 ENCOUNTER — Telehealth: Payer: Self-pay

## 2018-10-13 ENCOUNTER — Encounter: Payer: Self-pay | Admitting: *Deleted

## 2018-10-13 NOTE — Telephone Encounter (Signed)
Per lab notes pt is willing to do the eye X-ray sent to PCP

## 2018-10-13 NOTE — Telephone Encounter (Signed)
Order placed previously. He can come in without an appointment to have this completed.

## 2018-10-14 NOTE — Telephone Encounter (Signed)
Called and spoke with patient. Pt advised and voiced understanding.  

## 2018-10-15 ENCOUNTER — Other Ambulatory Visit: Payer: Self-pay | Admitting: Family Medicine

## 2018-10-15 ENCOUNTER — Ambulatory Visit (INDEPENDENT_AMBULATORY_CARE_PROVIDER_SITE_OTHER): Payer: BLUE CROSS/BLUE SHIELD

## 2018-10-15 DIAGNOSIS — M5412 Radiculopathy, cervical region: Secondary | ICD-10-CM | POA: Diagnosis not present

## 2018-10-15 DIAGNOSIS — Z77018 Contact with and (suspected) exposure to other hazardous metals: Secondary | ICD-10-CM | POA: Diagnosis not present

## 2018-11-06 ENCOUNTER — Other Ambulatory Visit: Payer: Self-pay | Admitting: Family Medicine

## 2018-11-14 ENCOUNTER — Ambulatory Visit
Admission: RE | Admit: 2018-11-14 | Discharge: 2018-11-14 | Disposition: A | Discharge status: Home / Self Care | Payer: BLUE CROSS/BLUE SHIELD | Source: Ambulatory Visit | Attending: Family Medicine | Admitting: Family Medicine

## 2018-11-14 DIAGNOSIS — M5412 Radiculopathy, cervical region: Secondary | ICD-10-CM | POA: Diagnosis not present

## 2018-11-14 DIAGNOSIS — M4802 Spinal stenosis, cervical region: Secondary | ICD-10-CM | POA: Diagnosis not present

## 2018-11-23 ENCOUNTER — Telehealth: Payer: Self-pay | Admitting: Family Medicine

## 2018-11-23 DIAGNOSIS — M5412 Radiculopathy, cervical region: Secondary | ICD-10-CM

## 2018-11-23 NOTE — Telephone Encounter (Signed)
Patient is requesting to see neurosurgery as requested by Dr. Caryl Bis

## 2018-11-23 NOTE — Telephone Encounter (Signed)
Sent to PCP to place referral  

## 2018-11-23 NOTE — Telephone Encounter (Signed)
Patient called. Reviewed physician's result note of MRI (dated 11/14/18).  Patient has not seen Neurology, has a pending appointment in February. He would prefer to see Neurosurgeon as Dr. Caryl Bis has suggested.  Routed to PCP for referral to Neurosurgeon.

## 2018-11-23 NOTE — Telephone Encounter (Signed)
Referral placed.  He should call neurology to cancel his appointment.  Thanks.

## 2018-11-23 NOTE — Addendum Note (Signed)
Addended by: Leone Haven on: 11/23/2018 07:40 PM   Modules accepted: Orders

## 2018-11-26 NOTE — Telephone Encounter (Signed)
Pt called to advise he has appt with neuro on 2/04 and has cancelled the other appt

## 2018-12-01 DIAGNOSIS — R200 Anesthesia of skin: Secondary | ICD-10-CM | POA: Diagnosis not present

## 2018-12-02 NOTE — Telephone Encounter (Signed)
Called and spoke with patient pt stated that he did go see the  neurosurgeon and they did not feel that surgery was needed but the neurosurgeon did refer him too see another neurologist.   Sent to PCP as an [place].

## 2018-12-02 NOTE — Telephone Encounter (Signed)
Noted  

## 2018-12-11 ENCOUNTER — Ambulatory Visit: Payer: BLUE CROSS/BLUE SHIELD | Admitting: Neurology

## 2018-12-30 DIAGNOSIS — R251 Tremor, unspecified: Secondary | ICD-10-CM | POA: Diagnosis not present

## 2018-12-30 DIAGNOSIS — G4733 Obstructive sleep apnea (adult) (pediatric): Secondary | ICD-10-CM | POA: Diagnosis not present

## 2018-12-30 DIAGNOSIS — R200 Anesthesia of skin: Secondary | ICD-10-CM | POA: Diagnosis not present

## 2018-12-30 DIAGNOSIS — R202 Paresthesia of skin: Secondary | ICD-10-CM | POA: Diagnosis not present

## 2018-12-30 DIAGNOSIS — E559 Vitamin D deficiency, unspecified: Secondary | ICD-10-CM | POA: Diagnosis not present

## 2019-01-04 DIAGNOSIS — R202 Paresthesia of skin: Secondary | ICD-10-CM | POA: Diagnosis not present

## 2019-01-04 DIAGNOSIS — R200 Anesthesia of skin: Secondary | ICD-10-CM | POA: Diagnosis not present

## 2019-01-12 ENCOUNTER — Encounter: Payer: Self-pay | Admitting: Internal Medicine

## 2019-01-12 ENCOUNTER — Other Ambulatory Visit: Payer: Self-pay

## 2019-01-12 ENCOUNTER — Ambulatory Visit
Admission: RE | Admit: 2019-01-12 | Discharge: 2019-01-12 | Disposition: A | Discharge status: Home / Self Care | Payer: BLUE CROSS/BLUE SHIELD | Source: Ambulatory Visit | Attending: Internal Medicine | Admitting: Internal Medicine

## 2019-01-12 ENCOUNTER — Inpatient Hospital Stay: Payer: BLUE CROSS/BLUE SHIELD | Attending: Internal Medicine | Admitting: Internal Medicine

## 2019-01-12 ENCOUNTER — Inpatient Hospital Stay: Payer: BLUE CROSS/BLUE SHIELD

## 2019-01-12 DIAGNOSIS — D472 Monoclonal gammopathy: Secondary | ICD-10-CM

## 2019-01-12 DIAGNOSIS — G4730 Sleep apnea, unspecified: Secondary | ICD-10-CM | POA: Insufficient documentation

## 2019-01-12 DIAGNOSIS — F1011 Alcohol abuse, in remission: Secondary | ICD-10-CM | POA: Diagnosis not present

## 2019-01-12 DIAGNOSIS — M5136 Other intervertebral disc degeneration, lumbar region: Secondary | ICD-10-CM | POA: Insufficient documentation

## 2019-01-12 DIAGNOSIS — G629 Polyneuropathy, unspecified: Secondary | ICD-10-CM | POA: Insufficient documentation

## 2019-01-12 DIAGNOSIS — Z801 Family history of malignant neoplasm of trachea, bronchus and lung: Secondary | ICD-10-CM | POA: Insufficient documentation

## 2019-01-12 DIAGNOSIS — Z87891 Personal history of nicotine dependence: Secondary | ICD-10-CM | POA: Diagnosis not present

## 2019-01-12 DIAGNOSIS — G63 Polyneuropathy in diseases classified elsewhere: Secondary | ICD-10-CM | POA: Diagnosis not present

## 2019-01-12 DIAGNOSIS — Z79899 Other long term (current) drug therapy: Secondary | ICD-10-CM

## 2019-01-12 LAB — COMPREHENSIVE METABOLIC PANEL
ALBUMIN: 4.7 g/dL (ref 3.5–5.0)
ALT: 22 U/L (ref 0–44)
AST: 17 U/L (ref 15–41)
Alkaline Phosphatase: 61 U/L (ref 38–126)
Anion gap: 8 (ref 5–15)
BUN: 14 mg/dL (ref 6–20)
CO2: 26 mmol/L (ref 22–32)
Calcium: 9.3 mg/dL (ref 8.9–10.3)
Chloride: 105 mmol/L (ref 98–111)
Creatinine, Ser: 1.05 mg/dL (ref 0.61–1.24)
GFR calc Af Amer: >60 mL/min (ref >60)
GFR calc non Af Amer: >60 mL/min (ref >60)
GLUCOSE: 96 mg/dL (ref 70–99)
Potassium: 3.7 mmol/L (ref 3.5–5.1)
SODIUM: 139 mmol/L (ref 135–145)
Total Bilirubin: 0.7 mg/dL (ref 0.3–1.2)
Total Protein: 7.7 g/dL (ref 6.5–8.1)

## 2019-01-12 LAB — LACTATE DEHYDROGENASE: LDH: 108 U/L (ref 98–192)

## 2019-01-12 NOTE — Progress Notes (Signed)
[facility] NOTE  Patient Care Team: Leone Haven, MD as PCP - General (Family Medicine)  CHIEF COMPLAINTS/PURPOSE OF CONSULTATION: MONOCLONAL GAMMOPATHY   # Feb 2020 IgM monoclonal protein 0.7 g/dL [Dr.Shah]-normal CBC; no obvious evidence of organ dysfunction.   #Peripheral sensory neuropathy [Dr.Shah]  #History of alcohol abuse currently quit.  No history exists.     HISTORY OF PRESENTING ILLNESS:  Henry Garrett 53 y.o.  male with no significant medical problems has been referred to [place] for further evaluation of monoclonal gammopathy.  Patient was recently evaluated by neurology for ongoing tingling and numbness of fingertips and toes for the last 1 year.  He is currently awaiting imaging studies.  He has been started on vitamin M46 folic acid/alpha lipoic acid.  Notes a slight improvement.  With regards neuropathy patient has not any falls.  He had a mechanical fall which he tripped.  Denies this being attributed to his neuropathy.  No foot drop.  Appetite is good but no weight loss.  No bone pain.  No nausea no vomiting.  No fatigue.  Review of Systems  Constitutional: Negative for chills, diaphoresis, fever, malaise/fatigue and weight loss.  HENT: Negative for nosebleeds and sore throat.   Eyes: Negative for double vision.  Respiratory: Negative for cough, hemoptysis, sputum production, shortness of breath and wheezing.   Cardiovascular: Negative for chest pain, palpitations, orthopnea and leg swelling.  Gastrointestinal: Negative for abdominal pain, blood in stool, constipation, diarrhea, heartburn, melena, nausea and vomiting.  Genitourinary: Negative for dysuria, frequency and urgency.  Musculoskeletal: Negative for back pain and joint pain.  Skin: Negative.  Negative for itching and rash.  Neurological: Positive for tingling. Negative for dizziness, focal weakness, weakness and headaches.  Endo/Heme/Allergies: Does not bruise/bleed  easily.  Psychiatric/Behavioral: Negative for depression. The patient is not nervous/anxious and does not have insomnia.      MEDICAL HISTORY:  Past Medical History:  Diagnosis Date  . Sleep apnea    CPAP 12-20cmH2O    SURGICAL HISTORY: Past Surgical History:  Procedure Laterality Date  . HERNIA REPAIR     infant  . TONSILECTOMY, ADENOIDECTOMY, BILATERAL MYRINGOTOMY AND TUBES      SOCIAL HISTORY: Social History   Socioeconomic History  . Marital status: Married    Spouse name: Not on file  . Number of children: Not on file  . Years of education: Not on file  . Highest education level: Not on file  Occupational History  . Not on file  Social Needs  . Financial resource strain: Not on file  . Food insecurity:    Worry: Not on file    Inability: Not on file  . Transportation needs:    Medical: Not on file    Non-medical: Not on file  Tobacco Use  . Smoking status: Former Smoker    Last attempt to quit: 03/17/2000    Years since quitting: 18.8  . Smokeless tobacco: Never Used  Substance and Sexual Activity  . Alcohol use: Yes    Alcohol/week: 20.0 standard drinks    Types: 20 Cans of beer per week  . Drug use: No  . Sexual activity: Not on file  Lifestyle  . Physical activity:    Days per week: Not on file    Minutes per session: Not on file  . Stress: Not on file  Relationships  . Social connections:    Talks on phone: Not on file    Gets together: Not on file  Attends religious service: Not on file    Active member of club or organization: Not on file    Attends meetings of clubs or organizations: Not on file    Relationship status: Not on file  . Intimate partner violence:    Fear of current or ex partner: Not on file    Emotionally abused: Not on file    Physically abused: Not on file    Forced sexual activity: Not on file  Other Topics Concern  . Not on file  Social History Narrative   Lives in [place]. From [place]. Married, 3 children  19,15,11.  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     FAMILY HISTORY: Family History  Problem Relation Age of Onset  . Heart disease Brother        heart valve replacement, [place]  . Arthritis Mother        s/p bilateral knee replacement  . Lung cancer Father        suspcted lung cancer/ Hx of smoking    ALLERGIES:  has No Known Allergies.  MEDICATIONS:  Current Outpatient Medications  Medication Sig Dispense Refill  . Alpha-Lipoic Acid 600 MG CAPS Take by mouth.    . folic acid (FOLVITE) 1 MG tablet Take 1 mg by mouth daily.    . vitamin B-12 (CYANOCOBALAMIN) 100 MCG tablet Take 100 mcg by mouth daily.     No current facility-administered medications for this visit.       Marland Kitchen  PHYSICAL EXAMINATION: ECOG PERFORMANCE STATUS: 0 - Asymptomatic  Vitals:   01/12/19 1412  BP: (!) 153/94  Pulse: 87  Resp: 16  Temp: 98.2 F (36.8 C)   Filed Weights   01/12/19 1412  Weight: 225 lb 3.2 oz (102.2 kg)    Physical Exam  Constitutional: He is oriented to person, place, and time and well-developed, well-nourished, and in no distress.  HENT:  Head: Normocephalic and atraumatic.  Mouth/Throat: Oropharynx is clear and moist. No oropharyngeal exudate.  Eyes: Pupils are equal, round, and reactive to light.  Neck: Normal range of motion. Neck supple.  Cardiovascular: Normal rate and regular rhythm.  Pulmonary/Chest: No respiratory distress. He has no wheezes.  Abdominal: Soft. Bowel sounds are normal. He exhibits no distension and no mass. There is no abdominal tenderness. There is no rebound and no guarding.  Musculoskeletal: Normal range of motion.        General: No tenderness or edema.  Neurological: He is alert and oriented to person, place, and time.  Skin: Skin is warm.  Psychiatric: Affect normal.     LABORATORY DATA:  I have reviewed the data as listed Lab Results  Component Value Date   WBC  7.8 05/12/2017   HGB 14.4 05/12/2017   HCT 41.7 05/12/2017   MCV 92.3 05/12/2017   PLT 257.0 05/12/2017   Recent Labs    06/01/18 1031 01/12/19 1509  NA 140 139  K 4.0 3.7  CL 105 105  CO2 28 26  GLUCOSE 106* 96  BUN 12 14  CREATININE 0.96 1.05  CALCIUM 9.6 9.3  GFRNONAA  --  >60  GFRAA  --  >60  PROT 7.2 7.7  ALBUMIN 4.5 4.7  AST 13 17  ALT 19 22  ALKPHOS 58 61  BILITOT 0.6 0.7    RADIOGRAPHIC STUDIES: I have personally reviewed the radiological  images as listed and agreed with the findings in the report. Dg Bone Survey Met  Result Date: 01/12/2019 CLINICAL DATA:  Neuropathy with IgM monoclonal gammopathy. Multiple myeloma. Pt is a former smoker EXAM: METASTATIC BONE SURVEY COMPARISON:  None. FINDINGS: There are no osteoblastic or osteolytic bone lesions. No radiographic findings of multiple myeloma. Old healed left clavicle fracture. Widened left AC joint suggesting previous shoulder surgery. There are degenerative disc changes throughout the cervical or thoracic and lumbar spine. IMPRESSION: 1. No evidence of neoplastic disease to bone. No findings of multiple myeloma. Electronically Signed   By: Lajean Manes M.D.   On: 01/12/2019 20:48    ASSESSMENT & PLAN:   Neuropathy with IgM monoclonal gammopathy (HCC) #IgM monoclonal protein 0.7 g/dL-normal CBC; no obvious evidence of organ dysfunction.   # MGUS-long discussion with the patient regarding natural history of MGUS; small risk of progression to multiple myeloma. Patient is less likely at this time patient has any active myeloma.  Clinically not suggestive of amyloidosis or POEMS or Walden Strom's.  #Discussed regarding further work-up including chemistries; kappa lambda light chain ratio; skeletal survey.  Discussed regarding a bone marrow biopsy.  Hold bone marrow while awaiting above work-up.  #Discussed if this is related to MGUS-neuropathy treatment option would include IVIG/rituximab.  However currently await  above work-up.  #Peripheral neuropathy-mostly sensory; question related to IgM monoclonal gammopathy versus others.  Awaiting nerve conduction studies.  Slightly improved as per patient.  # Educated the patient regarding novel coronavirus-modes of transmission/risks; and measures to avoid infection.   Thank you Dr. Manuella Ghazi for allowing me to participate in the care of your pleasant patient. Please do not hesitate to contact me with questions or concerns in the interim.  Discussed with Dr. Manuella Ghazi.   # 60 minutes face-to-face with the patient discussing the above plan of care; more than 50% of time spent on prognosis/ natural history; counseling and coordination.  # DISPOSITION: cell- [phone] # labs today # TBD- Dr.B  Addendum:I spoke to patient regarding labs; given the nonemergent clinical situation/ covid 19-I would recommend follow up with me in 3 months.]; labs 1 week prior.   Please order labs [facility] CMP/myeloma panel kappa lambda light chain/beta-2 microglobulin] in approximately 3 months./Follow-up with me.   Cc; Dr.Shah/Sonn[place]    All questions were answered. The patient knows to call the clinic with any problems, questions or concerns.       Cammie Sickle, MD 01/15/2019 9:02 AM

## 2019-01-12 NOTE — Assessment & Plan Note (Addendum)
#  IgM monoclonal protein 0.7 g/dL-normal CBC; no obvious evidence of organ dysfunction.   # MGUS-long discussion with the patient regarding natural history of MGUS; small risk of progression to multiple myeloma. Patient is less likely at this time patient has any active myeloma.  Clinically not suggestive of amyloidosis or POEMS or Walden Strom's.  #Discussed regarding further work-up including chemistries; kappa lambda light chain ratio; skeletal survey.  Discussed regarding a bone marrow biopsy.  Hold bone marrow while awaiting above work-up.  #Discussed if this is related to MGUS-neuropathy treatment option would include IVIG/rituximab.  However currently await above work-up.  #Peripheral neuropathy-mostly sensory; question related to IgM monoclonal gammopathy versus others.  Awaiting nerve conduction studies.  Slightly improved as per patient.  # Educated the patient regarding novel coronavirus-modes of transmission/risks; and measures to avoid infection.   Thank you Dr. Manuella Ghazi for allowing me to participate in the care of your pleasant patient. Please do not hesitate to contact me with questions or concerns in the interim.  Discussed with Dr. Manuella Ghazi.   # 60 minutes face-to-face with the patient discussing the above plan of care; more than 50% of time spent on prognosis/ natural history; counseling and coordination.  # DISPOSITION: cell- [phone] # labs today # TBD- Dr.B  Addendum:I spoke to patient regarding labs; kappa/lambda light chain ratio 29/elevated.  Skeletal survey normal.. given the nonemergent clinical situation/ covid 19-I would recommend follow up with me in 3 months.]; labs 1 week prior.   Please order labs [facility] CMP/myeloma panel kappa lambda light chain/beta-2 microglobulin] in approximately 3 months./Follow-up with me.   Cc; Dr.Shah/Sonn[place]

## 2019-01-13 LAB — KAPPA/LAMBDA LIGHT CHAINS
Kappa free light chain: 102.4 mg/L — ABNORMAL HIGH (ref 3.3–19.4)
Kappa, lambda light chain ratio: 24.98 — ABNORMAL HIGH (ref 0.26–1.65)
Lambda free light chains: 4.1 mg/L — ABNORMAL LOW (ref 5.7–26.3)

## 2019-01-15 ENCOUNTER — Telehealth: Payer: Self-pay | Admitting: Internal Medicine

## 2019-01-15 DIAGNOSIS — D472 Monoclonal gammopathy: Secondary | ICD-10-CM

## 2019-01-15 DIAGNOSIS — G63 Polyneuropathy in diseases classified elsewhere: Principal | ICD-10-CM

## 2019-01-15 NOTE — Telephone Encounter (Signed)
Spoke to patient regarding labs; given the nonemergent clinical situation/ covid 19-I would recommend follow up with me in 3 months.]; labs 1 week prior.   Please order labs [facility] CMP/myeloma panel kappa lambda light chain/beta-2 microglobulin] in approximately 3 months./Follow-up with me.  Dr.B

## 2019-01-15 NOTE — Addendum Note (Signed)
Addended by: Sabino Gasser on: 01/15/2019 09:50 AM   Modules accepted: Orders

## 2019-01-22 DIAGNOSIS — R200 Anesthesia of skin: Secondary | ICD-10-CM | POA: Diagnosis not present

## 2019-01-22 DIAGNOSIS — R202 Paresthesia of skin: Secondary | ICD-10-CM | POA: Diagnosis not present

## 2019-01-27 DIAGNOSIS — R200 Anesthesia of skin: Secondary | ICD-10-CM | POA: Diagnosis not present

## 2019-01-27 DIAGNOSIS — R202 Paresthesia of skin: Secondary | ICD-10-CM | POA: Diagnosis not present

## 2019-02-22 DIAGNOSIS — D485 Neoplasm of uncertain behavior of skin: Secondary | ICD-10-CM | POA: Diagnosis not present

## 2019-02-22 DIAGNOSIS — L819 Disorder of pigmentation, unspecified: Secondary | ICD-10-CM | POA: Diagnosis not present

## 2019-02-22 DIAGNOSIS — L578 Other skin changes due to chronic exposure to nonionizing radiation: Secondary | ICD-10-CM | POA: Diagnosis not present

## 2019-02-22 DIAGNOSIS — D0439 Carcinoma in situ of skin of other parts of face: Secondary | ICD-10-CM | POA: Diagnosis not present

## 2019-02-26 DIAGNOSIS — C44320 Squamous cell carcinoma of skin of unspecified parts of face: Secondary | ICD-10-CM

## 2019-02-26 HISTORY — DX: Squamous cell carcinoma of skin of unspecified parts of face: C44.320

## 2019-03-16 DIAGNOSIS — D0439 Carcinoma in situ of skin of other parts of face: Secondary | ICD-10-CM | POA: Diagnosis not present

## 2019-04-09 DIAGNOSIS — G5601 Carpal tunnel syndrome, right upper limb: Secondary | ICD-10-CM | POA: Diagnosis not present

## 2019-04-16 ENCOUNTER — Inpatient Hospital Stay: Payer: BC Managed Care – PPO | Attending: Internal Medicine

## 2019-04-16 ENCOUNTER — Other Ambulatory Visit: Payer: Self-pay

## 2019-04-16 DIAGNOSIS — Z87891 Personal history of nicotine dependence: Secondary | ICD-10-CM | POA: Insufficient documentation

## 2019-04-16 DIAGNOSIS — Z79899 Other long term (current) drug therapy: Secondary | ICD-10-CM | POA: Diagnosis not present

## 2019-04-16 DIAGNOSIS — G629 Polyneuropathy, unspecified: Secondary | ICD-10-CM | POA: Diagnosis not present

## 2019-04-16 DIAGNOSIS — D472 Monoclonal gammopathy: Secondary | ICD-10-CM | POA: Diagnosis not present

## 2019-04-16 DIAGNOSIS — C880 Waldenstrom macroglobulinemia: Secondary | ICD-10-CM | POA: Insufficient documentation

## 2019-04-16 DIAGNOSIS — R5383 Other fatigue: Secondary | ICD-10-CM | POA: Insufficient documentation

## 2019-04-16 DIAGNOSIS — G4730 Sleep apnea, unspecified: Secondary | ICD-10-CM | POA: Insufficient documentation

## 2019-04-16 LAB — CBC WITH DIFFERENTIAL/PLATELET
Abs Immature Granulocytes: 0.03 10*3/uL (ref 0.00–0.07)
Basophils Absolute: 0 10*3/uL (ref 0.0–0.1)
Basophils Relative: 1 %
Eosinophils Absolute: 0.2 10*3/uL (ref 0.0–0.5)
Eosinophils Relative: 2 %
HCT: 41.4 % (ref 39.0–52.0)
Hemoglobin: 13.8 g/dL (ref 13.0–17.0)
Immature Granulocytes: 0 %
Lymphocytes Relative: 27 %
Lymphs Abs: 2.1 10*3/uL (ref 0.7–4.0)
MCH: 28.8 pg (ref 26.0–34.0)
MCHC: 33.3 g/dL (ref 30.0–36.0)
MCV: 86.3 fL (ref 80.0–100.0)
Monocytes Absolute: 0.8 10*3/uL (ref 0.1–1.0)
Monocytes Relative: 11 %
Neutro Abs: 4.8 10*3/uL (ref 1.7–7.7)
Neutrophils Relative %: 59 %
Platelets: 252 10*3/uL (ref 150–400)
RBC: 4.8 MIL/uL (ref 4.22–5.81)
RDW: 12.2 % (ref 11.5–15.5)
WBC: 8 10*3/uL (ref 4.0–10.5)
nRBC: 0 % (ref 0.0–0.2)

## 2019-04-16 LAB — COMPREHENSIVE METABOLIC PANEL
ALT: 20 U/L (ref 0–44)
AST: 14 U/L — ABNORMAL LOW (ref 15–41)
Albumin: 4.4 g/dL (ref 3.5–5.0)
Alkaline Phosphatase: 60 U/L (ref 38–126)
Anion gap: 9 (ref 5–15)
BUN: 12 mg/dL (ref 6–20)
CO2: 25 mmol/L (ref 22–32)
Calcium: 9 mg/dL (ref 8.9–10.3)
Chloride: 106 mmol/L (ref 98–111)
Creatinine, Ser: 0.97 mg/dL (ref 0.61–1.24)
GFR calc Af Amer: >60 mL/min (ref >60)
GFR calc non Af Amer: >60 mL/min (ref >60)
Glucose, Bld: 122 mg/dL — ABNORMAL HIGH (ref 70–99)
Potassium: 4 mmol/L (ref 3.5–5.1)
Sodium: 140 mmol/L (ref 135–145)
Total Bilirubin: 0.4 mg/dL (ref 0.3–1.2)
Total Protein: 7.5 g/dL (ref 6.5–8.1)

## 2019-04-19 LAB — MULTIPLE MYELOMA PANEL, SERUM
Albumin SerPl Elph-Mcnc: 3.7 g/dL (ref 2.9–4.4)
Albumin/Glob SerPl: 1.3 (ref 0.7–1.7)
Alpha 1: 0.2 g/dL (ref 0.0–0.4)
Alpha2 Glob SerPl Elph-Mcnc: 0.7 g/dL (ref 0.4–1.0)
B-Globulin SerPl Elph-Mcnc: 0.8 g/dL (ref 0.7–1.3)
Gamma Glob SerPl Elph-Mcnc: 1.2 g/dL (ref 0.4–1.8)
Globulin, Total: 2.9 g/dL (ref 2.2–3.9)
IgA: 74 mg/dL — ABNORMAL LOW (ref 90–386)
IgG (Immunoglobin G), Serum: 568 mg/dL — ABNORMAL LOW (ref 603–1613)
IgM (Immunoglobulin M), Srm: 990 mg/dL — ABNORMAL HIGH (ref 20–172)
M Protein SerPl Elph-Mcnc: 0.7 g/dL — ABNORMAL HIGH
Total Protein ELP: 6.6 g/dL (ref 6.0–8.5)

## 2019-04-19 LAB — KAPPA/LAMBDA LIGHT CHAINS
Kappa free light chain: 107.4 mg/L — ABNORMAL HIGH (ref 3.3–19.4)
Kappa, lambda light chain ratio: 24.41 — ABNORMAL HIGH (ref 0.26–1.65)
Lambda free light chains: 4.4 mg/L — ABNORMAL LOW (ref 5.7–26.3)

## 2019-04-21 LAB — BETA 2 MICROGLOBULIN, SERUM: Beta-2 Microglobulin: 1.3 mg/L (ref 0.6–2.4)

## 2019-04-22 ENCOUNTER — Other Ambulatory Visit: Payer: Self-pay

## 2019-04-23 ENCOUNTER — Inpatient Hospital Stay (HOSPITAL_BASED_OUTPATIENT_CLINIC_OR_DEPARTMENT_OTHER): Payer: BC Managed Care – PPO | Admitting: Internal Medicine

## 2019-04-23 ENCOUNTER — Other Ambulatory Visit: Payer: Self-pay

## 2019-04-23 VITALS — BP 155/91 | HR 78 | Temp 96.7°F | Resp 18 | Wt 226.1 lb

## 2019-04-23 DIAGNOSIS — R5383 Other fatigue: Secondary | ICD-10-CM

## 2019-04-23 DIAGNOSIS — Z79899 Other long term (current) drug therapy: Secondary | ICD-10-CM

## 2019-04-23 DIAGNOSIS — G4730 Sleep apnea, unspecified: Secondary | ICD-10-CM

## 2019-04-23 DIAGNOSIS — C880 Waldenstrom macroglobulinemia: Secondary | ICD-10-CM | POA: Diagnosis not present

## 2019-04-23 DIAGNOSIS — D472 Monoclonal gammopathy: Secondary | ICD-10-CM | POA: Diagnosis not present

## 2019-04-23 DIAGNOSIS — G629 Polyneuropathy, unspecified: Secondary | ICD-10-CM | POA: Diagnosis not present

## 2019-04-23 DIAGNOSIS — Z87891 Personal history of nicotine dependence: Secondary | ICD-10-CM

## 2019-04-23 NOTE — Progress Notes (Signed)
[facility] NOTE  Patient Care Team: Leone Haven, MD as PCP - General (Family Medicine)  CHIEF COMPLAINTS/PURPOSE OF CONSULTATION: MONOCLONAL GAMMOPATHY  # Feb 2020 IgM monoclonal protein 0.7 g/dL [Dr.Shah]-normal CBC; no obvious evidence of organ dysfunction.  June 2020-M protein 0.7; kappa lambda light chain ratio stable 24  #Peripheral sensory neuropathy [Dr.Shah]  #History of alcohol abuse currently quit. Oncology History   No history exists.     HISTORY OF PRESENTING ILLNESS:  Henry Garrett 53 y.o.  male with a history of peripheral neuropathy is here for follow-up for further evaluation of monoclonal gammopathy.  Patient continues to have tingling and numbness of the extremities.  However this is not interrupting his daily lifestyle.  At the same time it is not getting better or worse.  Complains of mild to moderate fatigue.  No weight loss.  Appetite is good.  Review of Systems  Constitutional: Positive for malaise/fatigue. Negative for chills, diaphoresis, fever and weight loss.  HENT: Negative for nosebleeds and sore throat.   Eyes: Negative for double vision.  Respiratory: Negative for cough, hemoptysis, sputum production, shortness of breath and wheezing.   Cardiovascular: Negative for chest pain, palpitations, orthopnea and leg swelling.  Gastrointestinal: Negative for abdominal pain, blood in stool, constipation, diarrhea, heartburn, melena, nausea and vomiting.  Genitourinary: Negative for dysuria, frequency and urgency.  Musculoskeletal: Negative for back pain and joint pain.  Skin: Negative.  Negative for itching and rash.  Neurological: Positive for tingling. Negative for dizziness, focal weakness, weakness and headaches.  Endo/Heme/Allergies: Does not bruise/bleed easily.  Psychiatric/Behavioral: Negative for depression. The patient is not nervous/anxious and does not have insomnia.      MEDICAL HISTORY:  Past Medical  History:  Diagnosis Date  . Sleep apnea    CPAP 12-20cmH2O    SURGICAL HISTORY: Past Surgical History:  Procedure Laterality Date  . HERNIA REPAIR     infant  . TONSILECTOMY, ADENOIDECTOMY, BILATERAL MYRINGOTOMY AND TUBES      SOCIAL HISTORY: Social History   Socioeconomic History  . Marital status: Married    Spouse name: Not on file  . Number of children: Not on file  . Years of education: Not on file  . Highest education level: Not on file  Occupational History  . Not on file  Social Needs  . Financial resource strain: Not on file  . Food insecurity    Worry: Not on file    Inability: Not on file  . Transportation needs    Medical: Not on file    Non-medical: Not on file  Tobacco Use  . Smoking status: Former Smoker    Quit date: 03/17/2000    Years since quitting: 19.1  . Smokeless tobacco: Never Used  Substance and Sexual Activity  . Alcohol use: Yes    Alcohol/week: 20.0 standard drinks    Types: 20 Cans of beer per week  . Drug use: No  . Sexual activity: Not on file  Lifestyle  . Physical activity    Days per week: Not on file    Minutes per session: Not on file  . Stress: Not on file  Relationships  . Social Herbalist on phone: Not on file    Gets together: Not on file    Attends religious service: Not on file    Active member of club or organization: Not on file    Attends meetings of clubs or organizations: Not on file  Relationship status: Not on file  . Intimate partner violence    Fear of current or ex partner: Not on file    Emotionally abused: Not on file    Physically abused: Not on file    Forced sexual activity: Not on file  Other Topics Concern  . Not on file  Social History Narrative   Lives in [place]. From [place]. Married, 3 children 19,15,11.  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     FAMILY HISTORY: Family History  Problem Relation Age  of Onset  . Heart disease Brother        heart valve replacement, [place]  . Arthritis Mother        s/p bilateral knee replacement  . Lung cancer Father        suspcted lung cancer/ Hx of smoking    ALLERGIES:  has No Known Allergies.  MEDICATIONS:  Current Outpatient Medications  Medication Sig Dispense Refill  . Alpha-Lipoic Acid 600 MG CAPS Take by mouth.    . folic acid (FOLVITE) 1 MG tablet Take 1 mg by mouth daily.    . vitamin B-12 (CYANOCOBALAMIN) 100 MCG tablet Take 100 mcg by mouth daily.     No current facility-administered medications for this visit.       Marland Kitchen  PHYSICAL EXAMINATION: ECOG PERFORMANCE STATUS: 0 - Asymptomatic  Vitals:   04/23/19 1002  BP: (!) 155/91  Pulse: 78  Resp: 18  Temp: (!) 96.7 F (35.9 C)   Filed Weights   04/23/19 1002  Weight: 226 lb 1.6 oz (102.6 kg)    Physical Exam  Constitutional: He is oriented to person, place, and time and well-developed, well-nourished, and in no distress.  HENT:  Head: Normocephalic and atraumatic.  Mouth/Throat: Oropharynx is clear and moist. No oropharyngeal exudate.  Eyes: Pupils are equal, round, and reactive to light.  Neck: Normal range of motion. Neck supple.  Cardiovascular: Normal rate and regular rhythm.  Pulmonary/Chest: No respiratory distress. He has no wheezes.  Abdominal: Soft. Bowel sounds are normal. He exhibits no distension and no mass. There is no abdominal tenderness. There is no rebound and no guarding.  Musculoskeletal: Normal range of motion.        General: No tenderness or edema.  Neurological: He is alert and oriented to person, place, and time.  Skin: Skin is warm.  Psychiatric: Affect normal.     LABORATORY DATA:  I have reviewed the data as listed Lab Results  Component Value Date   WBC 8.0 04/16/2019   HGB 13.8 04/16/2019   HCT 41.4 04/16/2019   MCV 86.3 04/16/2019   PLT 252 04/16/2019   Recent Labs    06/01/18 1031 01/12/19 1509 04/16/19 1452  NA 140 139  140  K 4.0 3.7 4.0  CL 105 105 106  CO2 '28 26 25  ' GLUCOSE 106* 96 122*  BUN '12 14 12  ' CREATININE 0.96 1.05 0.97  CALCIUM 9.6 9.3 9.0  GFRNONAA  --  >60 >60  GFRAA  --  >60 >60  PROT 7.2 7.7 7.5  ALBUMIN 4.5 4.7 4.4  AST 13 17 14*  ALT '19 22 20  ' ALKPHOS 58 61 60  BILITOT 0.6 0.7 0.4    RADIOGRAPHIC STUDIES: I have personally reviewed the radiological images as listed and agreed with the findings in the report. No results found.  ASSESSMENT & PLAN:   Neuropathy with IgM monoclonal gammopathy (HCC) #MGUS versus others.  IgM monoclonal protein 0.7 g/dL; kappa lambda light chain ratio-stable at 24 [compared to 6 months ago] CBC; no obvious evidence of organ dysfunction.  Hold off bone marrow biopsy at this time  #Fatigue/persistent neuropathy-rule out [facility] lymphoma-given abnormal IgM levels;  Recommend CT of the chest abdomen pelvis-evaluate for lymphadenopathy.  Will call with results.  #Peripheral neuropathy-mostly sensory secondary to IgM protein.  # DISPOSITION:  # CT scan C/A/P in 1 week; will call with results - cell- [phone] # TBD- Dr.B    All questions were answered. The patient knows to call the clinic with any problems, questions or concerns.       Cammie Sickle, MD 04/23/2019 12:32 PM

## 2019-04-23 NOTE — Assessment & Plan Note (Addendum)
#  MGUS versus others.  IgM monoclonal protein 0.7 g/dL; kappa lambda light chain ratio-stable at 24 [compared to 6 months ago] CBC; no obvious evidence of organ dysfunction.  Hold off bone marrow biopsy at this time  #Fatigue/persistent neuropathy-rule out [facility] lymphoma-given abnormal IgM levels;  Recommend CT of the chest abdomen pelvis-evaluate for lymphadenopathy.  Will call with results.  #Peripheral neuropathy-mostly sensory secondary to IgM protein.  # DISPOSITION:  # CT scan C/A/P in 1 week; will call with results - cell- [phone] # TBD- Dr.B  Addendum: July 8th 2020-Left a message for the patient to call us back to discuss regarding the results of the CT scan.  I spoke to patient's neurologist Dr. Leavy Cella on CT scan no obvious concerns for [facility] macroglobulinemia.  Patient IgM levels/kappa lambda light chain continue to be elevated-otherwise no organ dysfunction noted except for neuropathy.  No obvious role for myeloma associated treatment.  Recommend checking kappa lambda light chain ratio/M protein with immunofixation every 6 months/along with CBC CMP/LDH. Defer to neurology regarding further treatment options for patient neuropathy.  Dr. Manuella Ghazi kindly agrees to follow the patient in his clinic.  Patient will follow up with [place] only as needed.

## 2019-04-23 NOTE — Progress Notes (Signed)
Patient does not offer any problems today. Neuropathy is stable and followed by a neurologist.

## 2019-04-28 ENCOUNTER — Ambulatory Visit: Admission: RE | Admit: 2019-04-28 | Payer: BC Managed Care – PPO | Source: Ambulatory Visit

## 2019-04-28 ENCOUNTER — Ambulatory Visit
Admission: RE | Admit: 2019-04-28 | Discharge: 2019-04-28 | Disposition: A | Discharge status: Home / Self Care | Payer: BC Managed Care – PPO | Source: Ambulatory Visit | Attending: Internal Medicine | Admitting: Internal Medicine

## 2019-04-28 ENCOUNTER — Other Ambulatory Visit: Payer: Self-pay

## 2019-04-28 DIAGNOSIS — D472 Monoclonal gammopathy: Secondary | ICD-10-CM | POA: Diagnosis not present

## 2019-04-28 DIAGNOSIS — C880 Waldenstrom macroglobulinemia: Secondary | ICD-10-CM | POA: Diagnosis not present

## 2019-04-28 DIAGNOSIS — R590 Localized enlarged lymph nodes: Secondary | ICD-10-CM | POA: Diagnosis not present

## 2019-04-28 DIAGNOSIS — G63 Polyneuropathy in diseases classified elsewhere: Secondary | ICD-10-CM | POA: Diagnosis not present

## 2019-04-28 MED ORDER — IOHEXOL 300 MG/ML  SOLN
100.0000 mL | Freq: Once | INTRAMUSCULAR | Status: AC | PRN
  Administered 2019-04-28: 100 mL via INTRAVENOUS

## 2019-05-04 ENCOUNTER — Telehealth: Payer: Self-pay

## 2019-05-04 NOTE — Telephone Encounter (Signed)
Called the patient to address his cologuard order that will expire 06/18/2019. Kit was delivered according to [facility], clarify if patient want to complete the screening.  Tysha Grismore,cma

## 2019-05-05 ENCOUNTER — Telehealth: Payer: Self-pay | Admitting: Internal Medicine

## 2019-05-05 NOTE — Telephone Encounter (Signed)
Left a message for the patient to call us back to discuss regarding the results of the CT scan.  I spoke to patient's neurologist Dr. Leavy Cella on CT scan no obvious concerns for [facility] macroglobulinemia.  Patient IgM levels/kappa lambda light chain continue to be elevated-otherwise no organ dysfunction noted except for neuropathy.  No obvious role for myeloma associated treatment.  Recommend checking kappa lambda light chain ratio/M protein with immunofixation every 6 months/along with CBC CMP/LDH. Defer to neurology regarding further treatment options for patient neuropathy.  Dr. Manuella Ghazi kindly agrees to follow the patient in his clinic.  Patient will follow up with [place] only as needed.

## 2019-05-06 ENCOUNTER — Telehealth: Payer: Self-pay | Admitting: Family Medicine

## 2019-05-06 ENCOUNTER — Telehealth: Payer: Self-pay | Admitting: Internal Medicine

## 2019-05-06 DIAGNOSIS — D472 Monoclonal gammopathy: Secondary | ICD-10-CM

## 2019-05-06 NOTE — Telephone Encounter (Signed)
I am happy to order this. I will have my office reach out to him to let him know and get him scheduled for follow-up with me as well.

## 2019-05-06 NOTE — Telephone Encounter (Signed)
#   I Spoke to patient regarding results of the CT scan no concerns for any obvious [facility] macroglobulinemia.  CT scan showed about 2 abdominal lymph nodes about centimeter in size/upper limit of normal.   #I would recommend follow-up CT scan in about a year to assess the stability of the lymph nodes/esp. given the context of elevated M protein.  Dr.Sonnenberg- would be willing to order the above CT scan?   #Spoke with Dr. Manuella Ghazi neurology-Recommend checking kappa lambda light chain ratio/M protein with immunofixation every 6 months/along with CBC CMP/LDH. Defer to neurology regarding further treatment options for patient neuropathy.

## 2019-05-06 NOTE — Telephone Encounter (Signed)
Can you let the patient know that Dr Rogue Bussing reached out to me about having me order a follow-up scan for the patient in one year? Please let the patient I am going to go ahead and get this ordered and we will need to plan to have a follow-up visit around December as well. Thanks.

## 2019-05-10 NOTE — Telephone Encounter (Signed)
I called and spoke with the patient and informed him of his CT scan, and to f/up with the provider in December, patient understood.  Nina,cma

## 2019-05-24 DIAGNOSIS — I781 Nevus, non-neoplastic: Secondary | ICD-10-CM | POA: Diagnosis not present

## 2019-05-24 DIAGNOSIS — L570 Actinic keratosis: Secondary | ICD-10-CM | POA: Diagnosis not present

## 2019-05-24 DIAGNOSIS — Z86007 Personal history of in-situ neoplasm of skin: Secondary | ICD-10-CM | POA: Diagnosis not present

## 2020-04-25 ENCOUNTER — Telehealth: Payer: Self-pay | Admitting: Family Medicine

## 2020-04-26 ENCOUNTER — Telehealth: Payer: Self-pay | Admitting: Family Medicine

## 2020-04-26 DIAGNOSIS — D472 Monoclonal gammopathy: Secondary | ICD-10-CM

## 2020-04-27 ENCOUNTER — Ambulatory Visit: Admission: RE | Admit: 2020-04-27 | Payer: 59 | Source: Ambulatory Visit

## 2020-04-28 NOTE — Telephone Encounter (Signed)
Message received from radiology that the patients CT scan needed insurance approval. Order placed again.

## 2020-05-11 ENCOUNTER — Telehealth: Payer: Self-pay | Admitting: Family Medicine

## 2020-05-18 NOTE — Telephone Encounter (Signed)
err

## 2020-05-26 ENCOUNTER — Ambulatory Visit
Admission: RE | Admit: 2020-05-26 | Discharge: 2020-05-26 | Disposition: A | Discharge status: Home / Self Care | Payer: 59 | Source: Ambulatory Visit | Attending: Family Medicine | Admitting: Family Medicine

## 2020-05-26 ENCOUNTER — Other Ambulatory Visit: Payer: Self-pay

## 2020-05-26 DIAGNOSIS — D472 Monoclonal gammopathy: Secondary | ICD-10-CM | POA: Insufficient documentation

## 2020-05-26 MED ORDER — IOHEXOL 300 MG/ML  SOLN
100.0000 mL | Freq: Once | INTRAMUSCULAR | Status: AC | PRN
  Administered 2020-05-26: 100 mL via INTRAVENOUS

## 2020-12-22 IMAGING — CT CT ABDOMEN AND PELVIS WITH CONTRAST
2 of 5 series · 14 of 46 positions shown, 16 images · IV contrast (omnipaque)
Comparison: Metastatic bone survey of 01/12/2019

CLINICAL DATA: Monoclonal gammopathy. Peripheral neuropathy. Mild
to moderate fatigue.

EXAM:
CT CHEST, ABDOMEN, AND PELVIS WITH CONTRAST
TECHNIQUE: Multidetector CT imaging of the chest, abdomen and pelvis was
performed following the standard protocol during bolus
administration of intravenous contrast.
CONTRAST:  100mL OMNIPAQUE IOHEXOL 300 MG/ML  SOLN

[Series 2: axials cap · axial · 0.82mm/px · z∈[-1591,-991]mm · 11 of 146 slices shown, 13 images]
[im 13/146  soft-tissue]
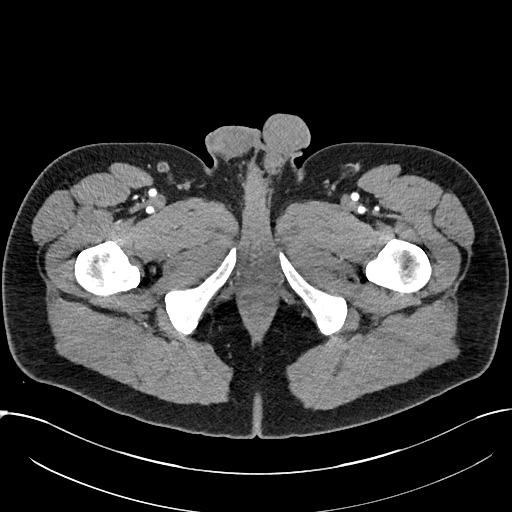
[im 13/146  bone]
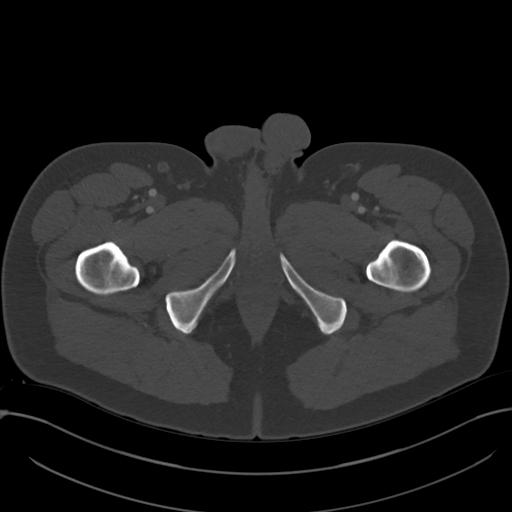
[im 25/146  soft-tissue]
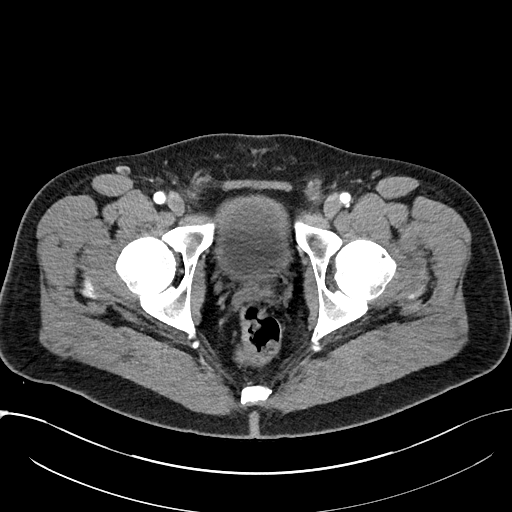
[im 37/146  soft-tissue]
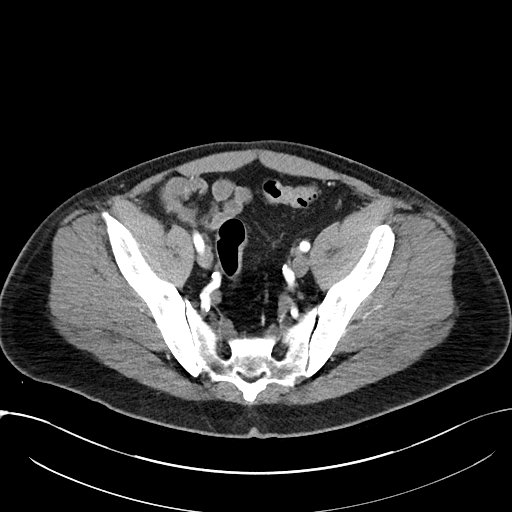
[im 49/146  soft-tissue]
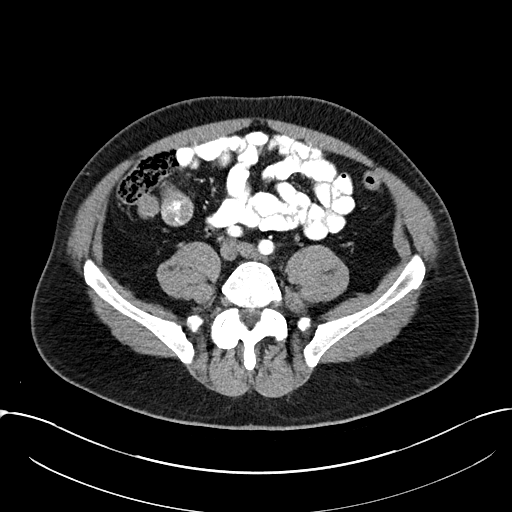
[im 61/146  soft-tissue]
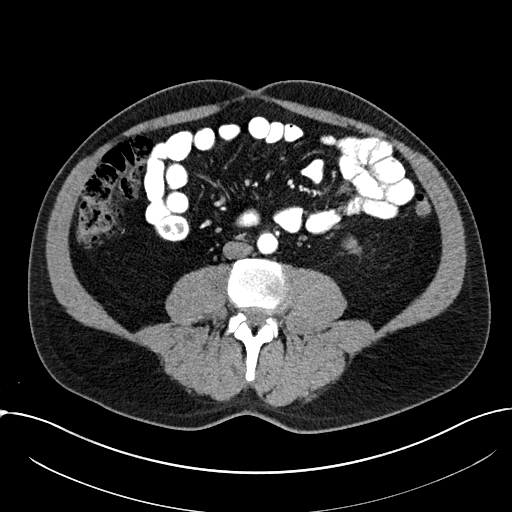
[im 73/146  soft-tissue]
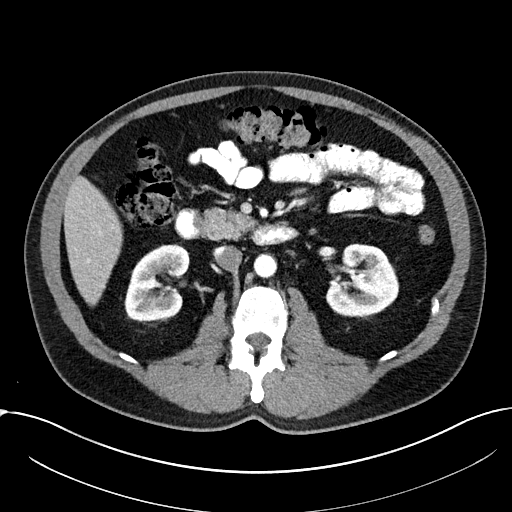
[im 85/146  soft-tissue]
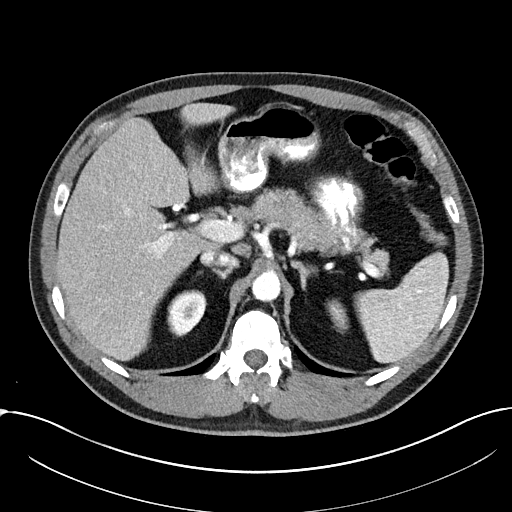
[im 97/146  soft-tissue]
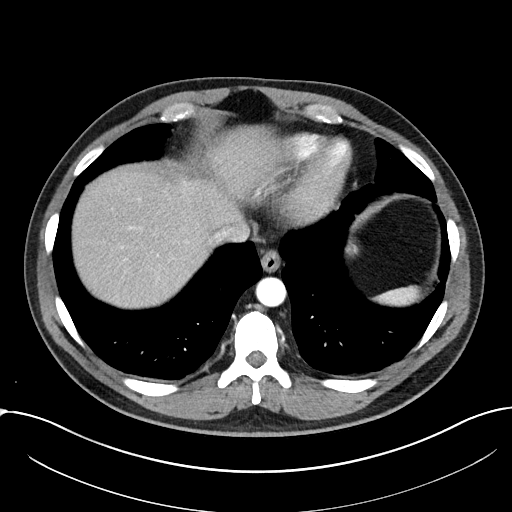
[im 109/146  soft-tissue]
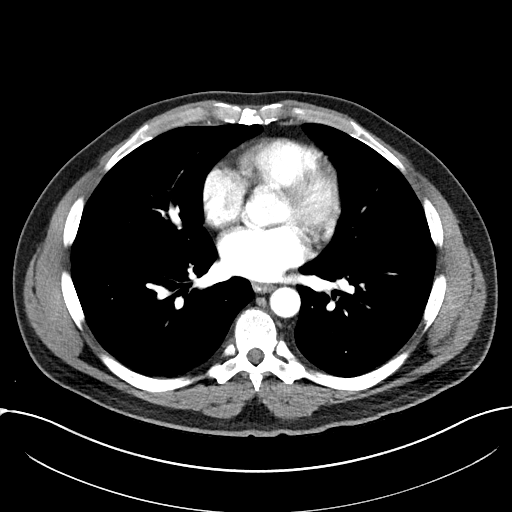
[im 109/146  bone]
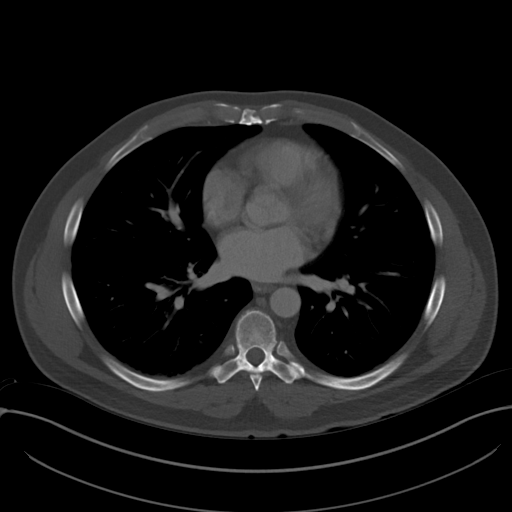
[im 121/146  soft-tissue]
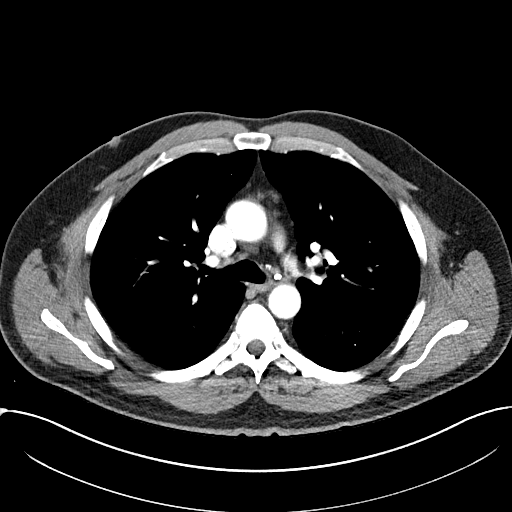
[im 133/146  soft-tissue]
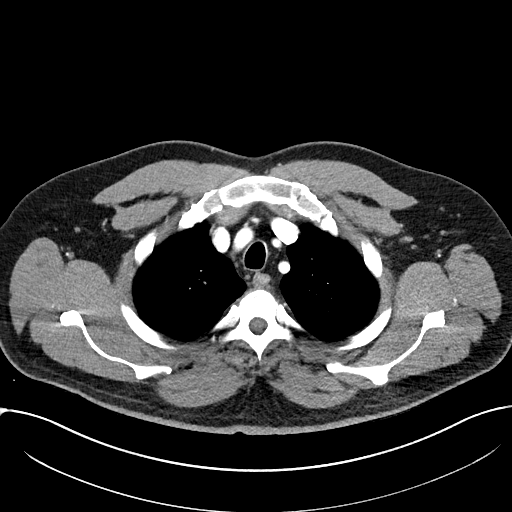

[Series 4: coronals cap · coronal · 0.82mm/px · 3 of 153 slices shown]
[im 51/153  soft-tissue]
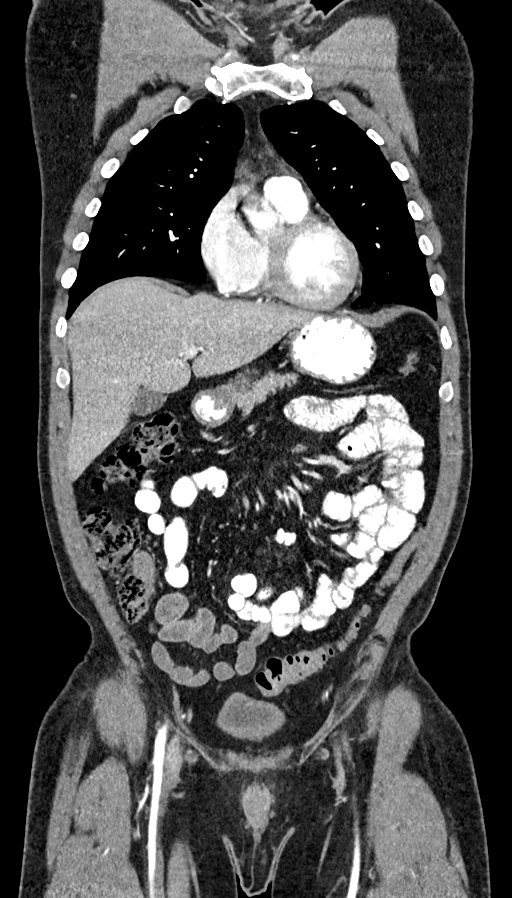
[im 68/153  soft-tissue]
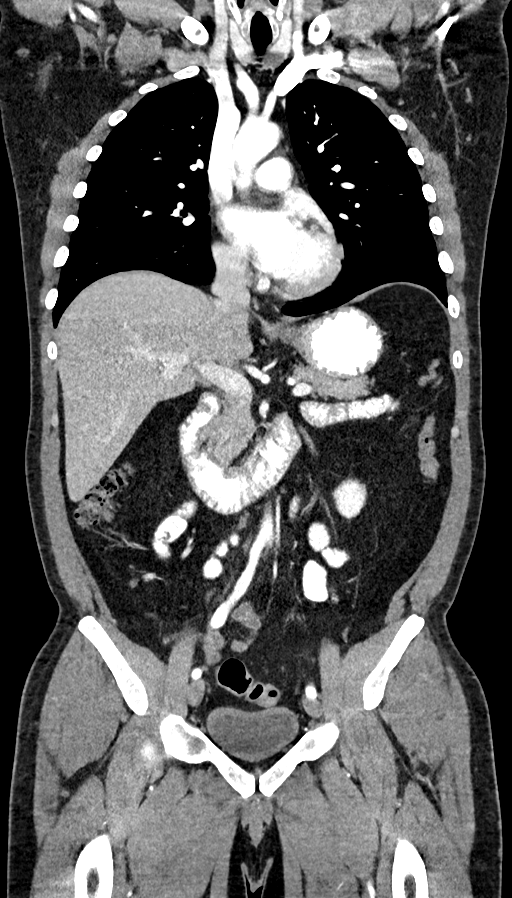
[im 85/153  soft-tissue]
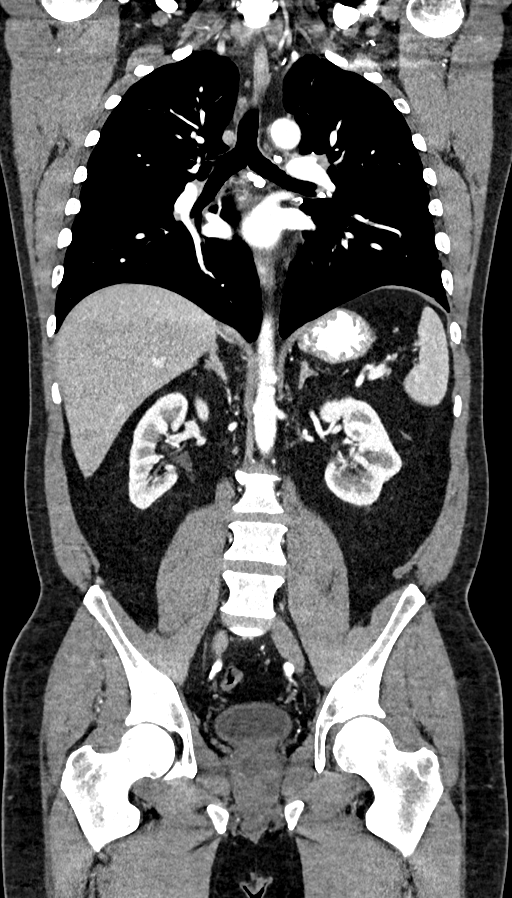

[14 of 46 positions shown; findings below may reference images not displayed]

FINDINGS: CT CHEST FINDINGS

Cardiovascular: Unremarkable

Mediastinum/Nodes: Calcified mediastinal lymph nodes compatible with
old granulomatous disease.

Lungs/Pleura: Calcified granuloma in the lingula.

Musculoskeletal: Old healed left clavicular fracture.

CT ABDOMEN PELVIS FINDINGS

Hepatobiliary: Unremarkable

Pancreas: Unremarkable

Spleen: Unremarkable

Adrenals/Urinary Tract: Unremarkable

Stomach/Bowel: Unremarkable

Vascular/Lymphatic: Aortoiliac atherosclerotic vascular disease.
Portacaval node 1.2 cm in short axis on image 61/2. Borderline
prominent porta hepatis/peripancreatic node measuring 1.0 cm in
short axis on image 62/2.

Reproductive: Unremarkable

Other: No supplemental non-categorized findings.

Musculoskeletal: Mild grade 1 degenerative retrolisthesis at L1-2
with mild loss of intervertebral disc height at this level. For
degenerative right facet arthropathy at L5-S1.
IMPRESSION: 1. Borderline prominent porta hepatis lymph node at 1.0 cm in
diameter. No overtly prominent adenopathy observed. No obvious
neoplastic bony lesions.
2. Old granulomatous disease.
3.  Aortic Atherosclerosis ([id number]).
4. Mild lumbar spondylosis and degenerative disc disease.

## 2022-05-20 ENCOUNTER — Encounter: Payer: Self-pay | Admitting: Family Medicine

## 2022-05-20 ENCOUNTER — Ambulatory Visit (INDEPENDENT_AMBULATORY_CARE_PROVIDER_SITE_OTHER): Payer: PRIVATE HEALTH INSURANCE | Admitting: Family Medicine

## 2022-05-20 VITALS — BP 122/82 | HR 95 | Temp 97.8°F | Ht 73.0 in | Wt 212.2 lb

## 2022-05-20 DIAGNOSIS — M25641 Stiffness of right hand, not elsewhere classified: Secondary | ICD-10-CM

## 2022-05-20 DIAGNOSIS — I10 Essential (primary) hypertension: Secondary | ICD-10-CM | POA: Diagnosis not present

## 2022-05-20 DIAGNOSIS — D472 Monoclonal gammopathy: Secondary | ICD-10-CM

## 2022-05-20 DIAGNOSIS — M7710 Lateral epicondylitis, unspecified elbow: Secondary | ICD-10-CM | POA: Insufficient documentation

## 2022-05-20 DIAGNOSIS — G63 Polyneuropathy in diseases classified elsewhere: Secondary | ICD-10-CM

## 2022-05-20 DIAGNOSIS — R7303 Prediabetes: Secondary | ICD-10-CM | POA: Insufficient documentation

## 2022-05-20 DIAGNOSIS — M7711 Lateral epicondylitis, right elbow: Secondary | ICD-10-CM

## 2022-05-20 DIAGNOSIS — R21 Rash and other nonspecific skin eruption: Secondary | ICD-10-CM | POA: Insufficient documentation

## 2022-05-20 DIAGNOSIS — Z1211 Encounter for screening for malignant neoplasm of colon: Secondary | ICD-10-CM

## 2022-05-20 DIAGNOSIS — R251 Tremor, unspecified: Secondary | ICD-10-CM

## 2022-05-20 DIAGNOSIS — E785 Hyperlipidemia, unspecified: Secondary | ICD-10-CM

## 2022-05-20 LAB — CBC WITH DIFFERENTIAL/PLATELET
Basophils Absolute: 0.1 10*3/uL (ref 0.0–0.1)
Basophils Relative: 1 % (ref 0.0–3.0)
Eosinophils Absolute: 0.1 10*3/uL (ref 0.0–0.7)
Eosinophils Relative: 1.6 % (ref 0.0–5.0)
HCT: 40.8 % (ref 39.0–52.0)
Hemoglobin: 13.8 g/dL (ref 13.0–17.0)
Lymphocytes Relative: 23.9 % (ref 12.0–46.0)
Lymphs Abs: 1.7 10*3/uL (ref 0.7–4.0)
MCHC: 33.8 g/dL (ref 30.0–36.0)
MCV: 87.4 fl (ref 78.0–100.0)
Monocytes Absolute: 0.8 10*3/uL (ref 0.1–1.0)
Monocytes Relative: 10.5 % (ref 3.0–12.0)
Neutro Abs: 4.5 10*3/uL (ref 1.4–7.7)
Neutrophils Relative %: 63 % (ref 43.0–77.0)
Platelets: 268 10*3/uL (ref 150.0–400.0)
RBC: 4.68 Mil/uL (ref 4.22–5.81)
RDW: 13.2 % (ref 11.5–15.5)
WBC: 7.2 10*3/uL (ref 4.0–10.5)

## 2022-05-20 LAB — COMPREHENSIVE METABOLIC PANEL
ALT: 19 U/L (ref 0–53)
AST: 17 U/L (ref 0–37)
Albumin: 4.7 g/dL (ref 3.5–5.2)
Alkaline Phosphatase: 56 U/L (ref 39–117)
BUN: 13 mg/dL (ref 6–23)
CO2: 27 mEq/L (ref 19–32)
Calcium: 9.2 mg/dL (ref 8.4–10.5)
Chloride: 102 mEq/L (ref 96–112)
Creatinine, Ser: 0.86 mg/dL (ref 0.40–1.50)
GFR: 98.09 mL/min (ref >60.00)
Glucose, Bld: 91 mg/dL (ref 70–99)
Potassium: 3.9 mEq/L (ref 3.5–5.1)
Sodium: 137 mEq/L (ref 135–145)
Total Bilirubin: 0.8 mg/dL (ref 0.2–1.2)
Total Protein: 6.9 g/dL (ref 6.0–8.3)

## 2022-05-20 LAB — LIPID PANEL
Cholesterol: 179 mg/dL (ref 0–200)
HDL: 34.4 mg/dL — ABNORMAL LOW (ref >39.00)
LDL Cholesterol: 132 mg/dL — ABNORMAL HIGH (ref 0–99)
NonHDL: 144.72
Total CHOL/HDL Ratio: 5
Triglycerides: 65 mg/dL (ref 0.0–149.0)
VLDL: 13 mg/dL (ref 0.0–40.0)

## 2022-05-20 LAB — HEMOGLOBIN A1C: Hgb A1c MFr Bld: 6 % (ref 4.6–6.5)

## 2022-05-20 MED ORDER — TRIAMCINOLONE ACETONIDE 0.1 % EX CREA: 1.0000 | TOPICAL_CREAM | Freq: Two times a day (BID) | CUTANEOUS | 0 refills | Status: DC

## 2022-05-20 NOTE — Assessment & Plan Note (Addendum)
Likely related to atopic dermatitis.  We will trial triamcinolone 0.1% twice daily for 7 to 14 days.  If not improving he will let me know and we can refer to dermatology.  The patient was advised to avoid getting the steroid in his eyes or near his genitalia given risk of adverse effects in those areas.

## 2022-05-20 NOTE — Assessment & Plan Note (Signed)
Likely arthritis related given the decreased flexion range of motion.  Discussed I did not think this would be related to his elbow pain given that I believe his elbow pain is related to tennis elbow.  We will get an x-ray of his right hand today.

## 2022-05-20 NOTE — Assessment & Plan Note (Signed)
Refer back to neurology.

## 2022-05-20 NOTE — Assessment & Plan Note (Signed)
I suspect the patient's right elbow pain is related to tennis elbow based on his exam.  Discussed tennis elbow brace.  Discussed icing the area.  If not improving over the next 1 to 2 weeks he will let us know and we can refer to sports medicine or orthopedics.

## 2022-05-20 NOTE — Assessment & Plan Note (Signed)
Check A1c.  Continue healthy activity level and healthy diet.  Discussed trying to get something for breakfast.

## 2022-05-20 NOTE — Assessment & Plan Note (Signed)
Adequately controlled.  Continue to monitor.  Check labs.

## 2022-05-20 NOTE — Progress Notes (Signed)
Tommi Rumps, MD Phone: [phone]  Henry Garrett is a 56 y.o. male who presents today for a visit to reestablish care.  Hyperlipidemia: No chest pain, shortness of breath, or claudication.  Prediabetes: Patient does not really eat breakfast.  He has lunchmeat and chips for lunch.  Has a healthy dinner.  He does not snack.  He has 1 diet soda daily.  No alcohol intake.  He gets 15000-20000 steps a day at work.  Elevated IgM: Patient was previously following with oncology for this.  He was to have labs every 6 months though was lost to follow-up.  He notes no weight loss or night sweats.  Right elbow pain: This is in the lateral epicondyle area.  This has been occurring over the last week.  Describes it as a dull ache.  No injury.  Does not do any repetitive motions.  He does not play tennis.  Right thumb range of motion issue: Patient reports over the last week he cannot extend his right thumb fully.  He notes there is no pain in his right thumb.  Rash on hands: Patient notes there are dry patches on the palms of his hands.  This has been going on about a year.  It is worse if he washes his hands a lot.  He notes that it cracks some.  He notes AmLactin works the best for this.  He is tried numerous over-the-counter lotions.  He has not tried any topical steroids.  Tremor: Patient notes he was following with neurology previously for this.  He notes the tremor has worsened recently.  Social History   Tobacco Use  Smoking Status Former   Types: Cigarettes   Quit date: 03/17/2000   Years since quitting: 22.1  Smokeless Tobacco Never    Current Outpatient Medications on File Prior to Visit  Medication Sig Dispense Refill   Alpha-Lipoic Acid 600 MG CAPS Take by mouth.     folic acid (FOLVITE) 1 MG tablet Take 1 mg by mouth daily.     vitamin B-12 (CYANOCOBALAMIN) 100 MCG tablet Take 100 mcg by mouth daily.     No current facility-administered medications on file prior to  visit.     ROS see history of present illness  Objective  Physical Exam Vitals:   05/20/22 0824  BP: 122/82  Pulse: 95  Temp: 97.8 F (36.6 C)  SpO2: 99%    BP Readings from Last 3 Encounters:  05/20/22 122/82  04/23/19 (!) 155/91  01/12/19 (!) 153/94   Wt Readings from Last 3 Encounters:  05/20/22 212 lb 3.2 oz (96.3 kg)  04/23/19 226 lb 1.6 oz (102.6 kg)  01/12/19 225 lb 3.2 oz (102.2 kg)    Physical Exam Constitutional:      General: He is not in acute distress.    Appearance: He is not diaphoretic.  Cardiovascular:     Rate and Rhythm: Normal rate and regular rhythm.     Heart sounds: Normal heart sounds.  Pulmonary:     Effort: Pulmonary effort is normal.     Breath sounds: Normal breath sounds.  Musculoskeletal:       Arms:     Comments: Right thumb with no tenderness, he has decreased extension range of motion in the right thumb, full flexion range of motion right thumb, full range of motion left thumb, 5/5 grip strength bilaterally, 5/5 thumb flexion and extension bilaterally  Skin:    General: Skin is warm and dry.  Comments: Palms have dry cracked rash with no erythema  Neurological:     Mental Status: He is alert.      Assessment/Plan: Please see individual problem list.  Problem List Items Addressed This Visit     Essential hypertension, benign - Primary (Chronic)    Adequately controlled.  Continue to monitor.  Check labs.      Hyperlipidemia (Chronic)    Check cholesterol.      Relevant Orders   Comp Met (CMET)   Lipid panel   Neuropathy with IgM monoclonal gammopathy (HCC) (Chronic)    Patient needs follow-up lab work.  Orders placed.  May need to refer back to oncology.      Relevant Orders   CBC w/Diff   Lactate dehydrogenase   Multiple Myeloma Panel (SPEP&IFE w/QIG)   Kappa/lambda light chains   Prediabetes (Chronic)    Check A1c.  Continue healthy activity level and healthy diet.  Discussed trying to get something for  breakfast.      Relevant Orders   HgB A1c   Rash (Chronic)    Likely related to atopic dermatitis.  We will trial triamcinolone 0.1% twice daily for 7 to 14 days.  If not improving he will let me know and we can refer to dermatology.  The patient was advised to avoid getting the steroid in his eyes or near his genitalia given risk of adverse effects in those areas.      Relevant Medications   triamcinolone cream (KENALOG) 0.1 %   Tremor (Chronic)    Refer back to neurology.      Relevant Orders   Ambulatory referral to Neurology   Decreased range of motion of right thumb    Likely arthritis related given the decreased flexion range of motion.  Discussed I did not think this would be related to his elbow pain given that I believe his elbow pain is related to tennis elbow.  We will get an x-ray of his right hand today.      Relevant Orders   DG Hand Complete Right   Tennis elbow    I suspect the patient's right elbow pain is related to tennis elbow based on his exam.  Discussed tennis elbow brace.  Discussed icing the area.  If not improving over the next 1 to 2 weeks he will let us know and we can refer to sports medicine or orthopedics.      Other Visit Diagnoses     Colon cancer screening       Relevant Orders   Ambulatory referral to Gastroenterology        Health Maintenance: Tetanus vaccine given today.  Referral for colonoscopy.  Return in about 6 months (around 11/20/2022).   Tommi Rumps, MD [place]

## 2022-05-20 NOTE — Assessment & Plan Note (Signed)
Patient needs follow-up lab work.  Orders placed.  May need to refer back to oncology.

## 2022-05-20 NOTE — Assessment & Plan Note (Signed)
Check cholesterol.

## 2022-05-20 NOTE — Patient Instructions (Signed)
Nice to see you. Please try the triamcinolone for 7 to 14 days.  If the rash on your hands is not improving please let me know and we can refer you to dermatology. I will refer you back to neurology for your tremor. We will get lab work today and contact you with the results. We will contact you with the x-ray results as well.

## 2022-05-21 ENCOUNTER — Telehealth: Payer: Self-pay

## 2022-05-21 ENCOUNTER — Other Ambulatory Visit: Payer: Self-pay

## 2022-05-21 DIAGNOSIS — Z1211 Encounter for screening for malignant neoplasm of colon: Secondary | ICD-10-CM

## 2022-05-21 LAB — KAPPA/LAMBDA LIGHT CHAINS
Kappa free light chain: 124.1 mg/L — ABNORMAL HIGH (ref 3.3–19.4)
Kappa:Lambda Ratio: 31.03 — ABNORMAL HIGH (ref 0.26–1.65)
Lambda Free Lght Chn: 4 mg/L — ABNORMAL LOW (ref 5.7–26.3)

## 2022-05-21 MED ORDER — NA SULFATE-K SULFATE-MG SULF 17.5-3.13-1.6 GM/177ML PO SOLN: 354.0000 mL | Freq: Once | ORAL | 0 refills | Status: AC

## 2022-05-21 NOTE — Telephone Encounter (Signed)
Gastroenterology Pre-Procedure Review  Request Date: 07/15/2022 Requesting Physician: Dr. Vicente Males  PATIENT REVIEW QUESTIONS: The patient responded to the following health history questions as indicated:    1. Are you having any GI issues? no 2. Do you have a personal history of Polyps? no 3. Do you have a family history of Colon Cancer or Polyps? no 4. Diabetes Mellitus? no 5. Joint replacements in the past 12 months?no 6. Major health problems in the past 3 months?no 7. Any artificial heart valves, MVP, or defibrillator?no    MEDICATIONS & ALLERGIES:    Patient reports the following regarding taking any anticoagulation/antiplatelet therapy:   Plavix, Coumadin, Eliquis, Xarelto, Lovenox, Pradaxa, Brilinta, or Effient? no Aspirin? No   Patient confirms/reports the following medications:  Current Outpatient Medications  Medication Sig Dispense Refill   Alpha-Lipoic Acid 600 MG CAPS Take by mouth.     folic acid (FOLVITE) 1 MG tablet Take 1 mg by mouth daily.     triamcinolone cream (KENALOG) 0.1 % Apply 1 Application topically 2 (two) times daily. For 7-14 days. 30 g 0   vitamin B-12 (CYANOCOBALAMIN) 100 MCG tablet Take 100 mcg by mouth daily.     No current facility-administered medications for this visit.    Patient confirms/reports the following allergies:  No Known Allergies  No orders of the defined types were placed in this encounter.   AUTHORIZATION INFORMATION Primary Insurance: 1D#: Group #:  Secondary Insurance: 1D#: Group #:  SCHEDULE INFORMATION: Date:  Time: Location:

## 2022-05-24 ENCOUNTER — Other Ambulatory Visit: Payer: Self-pay | Admitting: Family Medicine

## 2022-05-24 ENCOUNTER — Encounter: Payer: Self-pay | Admitting: Family Medicine

## 2022-05-24 DIAGNOSIS — D472 Monoclonal gammopathy: Secondary | ICD-10-CM

## 2022-05-24 LAB — MULTIPLE MYELOMA PANEL, SERUM
Albumin SerPl Elph-Mcnc: 3.9 g/dL (ref 2.9–4.4)
Albumin/Glob SerPl: 1.4 (ref 0.7–1.7)
Alpha 1: 0.2 g/dL (ref 0.0–0.4)
Alpha2 Glob SerPl Elph-Mcnc: 0.6 g/dL (ref 0.4–1.0)
B-Globulin SerPl Elph-Mcnc: 0.8 g/dL (ref 0.7–1.3)
Gamma Glob SerPl Elph-Mcnc: 1.4 g/dL (ref 0.4–1.8)
Globulin, Total: 3 g/dL (ref 2.2–3.9)
IgA/Immunoglobulin A, Serum: 55 mg/dL — ABNORMAL LOW (ref 90–386)
IgG (Immunoglobin G), Serum: 491 mg/dL — ABNORMAL LOW (ref 603–1613)
IgM (Immunoglobulin M), Srm: 1391 mg/dL — ABNORMAL HIGH (ref 20–172)
M Protein SerPl Elph-Mcnc: 1 g/dL — ABNORMAL HIGH
Total Protein: 6.9 g/dL (ref 6.0–8.5)

## 2022-05-27 ENCOUNTER — Ambulatory Visit
Admission: RE | Admit: 2022-05-27 | Discharge: 2022-05-27 | Disposition: A | Discharge status: Home / Self Care | Payer: PRIVATE HEALTH INSURANCE | Source: Ambulatory Visit | Attending: Family Medicine | Admitting: Family Medicine

## 2022-05-27 ENCOUNTER — Ambulatory Visit
Admission: RE | Admit: 2022-05-27 | Discharge: 2022-05-27 | Disposition: A | Discharge status: Home / Self Care | Payer: PRIVATE HEALTH INSURANCE | Attending: Family Medicine | Admitting: Family Medicine

## 2022-05-27 DIAGNOSIS — M25641 Stiffness of right hand, not elsewhere classified: Secondary | ICD-10-CM | POA: Insufficient documentation

## 2022-05-28 ENCOUNTER — Encounter: Payer: Self-pay | Admitting: Family Medicine

## 2022-05-28 ENCOUNTER — Telehealth: Payer: Self-pay | Admitting: Family Medicine

## 2022-05-28 DIAGNOSIS — D472 Monoclonal gammopathy: Secondary | ICD-10-CM

## 2022-05-28 NOTE — Telephone Encounter (Signed)
Patient called and would like his referral to Oncology, [employer] changed to [facility]

## 2022-05-28 NOTE — Addendum Note (Signed)
Addended by: Leone Haven on: 05/28/2022 08:49 AM   Modules accepted: Orders

## 2022-05-28 NOTE — Telephone Encounter (Signed)
I changed the referral. I will forward to Rasheedah to send it to [facility].

## 2022-06-04 NOTE — Telephone Encounter (Signed)
Ok Got it. Thank you!

## 2022-06-24 ENCOUNTER — Encounter: Payer: Self-pay | Admitting: Family Medicine

## 2022-06-24 DIAGNOSIS — G4733 Obstructive sleep apnea (adult) (pediatric): Secondary | ICD-10-CM

## 2022-07-15 ENCOUNTER — Ambulatory Visit: Payer: PRIVATE HEALTH INSURANCE | Admitting: Anesthesiology

## 2022-07-15 ENCOUNTER — Encounter
Admission: RE | Disposition: A | Discharge status: Home / Self Care | Payer: Self-pay | Source: Home / Self Care | Attending: Gastroenterology

## 2022-07-15 ENCOUNTER — Ambulatory Visit
Admission: RE | Admit: 2022-07-15 | Discharge: 2022-07-15 | Disposition: A | Discharge status: Home / Self Care | Payer: PRIVATE HEALTH INSURANCE | Attending: Gastroenterology | Admitting: Gastroenterology

## 2022-07-15 DIAGNOSIS — Z87891 Personal history of nicotine dependence: Secondary | ICD-10-CM | POA: Insufficient documentation

## 2022-07-15 DIAGNOSIS — D126 Benign neoplasm of colon, unspecified: Secondary | ICD-10-CM

## 2022-07-15 DIAGNOSIS — K5730 Diverticulosis of large intestine without perforation or abscess without bleeding: Secondary | ICD-10-CM | POA: Insufficient documentation

## 2022-07-15 DIAGNOSIS — Z1211 Encounter for screening for malignant neoplasm of colon: Secondary | ICD-10-CM | POA: Insufficient documentation

## 2022-07-15 DIAGNOSIS — Z85828 Personal history of other malignant neoplasm of skin: Secondary | ICD-10-CM | POA: Insufficient documentation

## 2022-07-15 DIAGNOSIS — I10 Essential (primary) hypertension: Secondary | ICD-10-CM | POA: Insufficient documentation

## 2022-07-15 DIAGNOSIS — K635 Polyp of colon: Secondary | ICD-10-CM | POA: Insufficient documentation

## 2022-07-15 DIAGNOSIS — G4730 Sleep apnea, unspecified: Secondary | ICD-10-CM | POA: Insufficient documentation

## 2022-07-15 DIAGNOSIS — Z0001 Encounter for general adult medical examination with abnormal findings: Secondary | ICD-10-CM

## 2022-07-15 HISTORY — PX: COLONOSCOPY WITH PROPOFOL: SHX5780

## 2022-07-15 SURGERY — COLONOSCOPY WITH PROPOFOL
Anesthesia: General

## 2022-07-15 MED ORDER — PROPOFOL 10 MG/ML IV BOLUS
INTRAVENOUS | Status: DC | PRN
  Administered 2022-07-15: 100 mg via INTRAVENOUS

## 2022-07-15 MED ORDER — PROPOFOL 500 MG/50ML IV EMUL
INTRAVENOUS | Status: DC | PRN
  Administered 2022-07-15: 200 ug/kg/min via INTRAVENOUS

## 2022-07-15 MED ORDER — SODIUM CHLORIDE 0.9 % IV SOLN
INTRAVENOUS | Status: DC
  Administered 2022-07-15: 20 mL/h via INTRAVENOUS

## 2022-07-15 MED ORDER — STERILE WATER FOR IRRIGATION IR SOLN
Status: DC | PRN
  Administered 2022-07-15: 60 mL

## 2022-07-15 NOTE — H&P (Signed)
Jonathon Bellows, MD [address], [place], [place], [place], [address] [address], [place], [place], [address] Phone: [phone]  Fax: [phone]  Primary Care Physician:  Leone Haven, MD   Pre-Procedure History & Physical: HPI:  Henry Garrett is a 56 y.o. male is here for an colonoscopy.   Past Medical History:  Diagnosis Date   Sleep apnea    CPAP 12-20cmH2O   Squamous cell carcinoma of skin of face 02/2019   left cheek. Tx with topical 5FU/Calcipotriene    Past Surgical History:  Procedure Laterality Date   HERNIA REPAIR     infant   TONSILECTOMY, ADENOIDECTOMY, BILATERAL MYRINGOTOMY AND TUBES      Prior to Admission medications   Medication Sig Start Date End Date Taking? Authorizing Provider  Alpha-Lipoic Acid 600 MG CAPS Take by mouth.   Yes [provider]  folic acid (FOLVITE) 1 MG tablet Take 1 mg by mouth daily.   Yes [provider]  triamcinolone cream (KENALOG) 0.1 % Apply 1 Application topically 2 (two) times daily. For 7-14 days. 05/20/22  Yes Leone Haven, MD  vitamin B-12 (CYANOCOBALAMIN) 100 MCG tablet Take 100 mcg by mouth daily.   Yes [provider]    Allergies as of 05/22/2022   (No Known Allergies)    Family History  Problem Relation Age of Onset   Heart disease Brother        heart valve replacement, [place]   Arthritis Mother        s/p bilateral knee replacement   Lung cancer Father        suspcted lung cancer/ Hx of smoking    Social History   Socioeconomic History   Marital status: Married    Spouse name: Not on file   Number of children: Not on file   Years of education: Not on file   Highest education level: Not on file  Occupational History   Not on file  Tobacco Use   Smoking status: Former    Types: Cigarettes    Quit date: 03/17/2000    Years since quitting: 22.3   Smokeless tobacco: Never  Substance and Sexual Activity   Alcohol use: Yes     Alcohol/week: 20.0 standard drinks of alcohol    Types: 20 Cans of beer per week   Drug use: No   Sexual activity: Not on file  Other Topics Concern   Not on file  Social History Narrative   Lives in [place]. From [place]. Married, 3 children 19,15,11.  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    Social Determinants of Health   Financial Resource Strain: Not on file  Food Insecurity: Not on file  Transportation Needs: Not on file  Physical Activity: Not on file  Stress: Not on file  Social Connections: Not on file  Intimate Partner Violence: Not on file    Review of Systems: See HPI, otherwise negative ROS  Physical Exam: BP (!) 141/90   Pulse 77   Temp (!) 97 F (36.1 C) (Temporal)   Resp 20   Ht '6\' 1"'$  (1.854 m)   Wt 95.3 kg   SpO2 100%   BMI 27.71 kg/m  General:   Alert,  pleasant and cooperative in NAD Head:  Normocephalic and atraumatic. Neck:  Supple; no masses or thyromegaly.  Lungs:  Clear throughout to auscultation, normal respiratory effort.    Heart:  +S1, +S2, Regular rate and rhythm, No edema. Abdomen:  Soft, nontender and nondistended. Normal bowel sounds, without guarding, and without rebound.   Neurologic:  Alert and  oriented x4;  grossly normal neurologically.  Impression/Plan: Henry Garrett is here for an colonoscopy to be performed for Screening colonoscopy average risk   Risks, benefits, limitations, and alternatives regarding  colonoscopy have been reviewed with the patient.  Questions have been answered.  All parties agreeable.   Jonathon Bellows, MD  07/15/2022, 9:10 AM

## 2022-07-15 NOTE — Anesthesia Procedure Notes (Signed)
Date/Time: 07/15/2022 9:19 AM  Performed by: Nelda Marseille, CRNAPre-anesthesia Checklist: Patient identified, Emergency Drugs available, Suction available, Patient being monitored and Timeout performed Oxygen Delivery Method: Nasal cannula

## 2022-07-15 NOTE — Transfer of Care (Signed)
Immediate Anesthesia Transfer of Care Note  Patient: Henry Garrett  Procedure(s) Performed: COLONOSCOPY WITH PROPOFOL  Patient Location: PACU  Anesthesia Type:General  Level of Consciousness: sedated  Airway & Oxygen Therapy: Patient Spontanous Breathing and Patient connected to nasal cannula oxygen  Post-op Assessment: Report given to RN and Post -op Vital signs reviewed and stable  Post vital signs: Reviewed and stable  Last Vitals:  Vitals Value Taken Time  BP 107/62 07/15/22 0934  Temp    Pulse 75 07/15/22 0935  Resp 12 07/15/22 0935  SpO2 97 % 07/15/22 0935  Vitals shown include unvalidated device data.  Last Pain:  Vitals:   07/15/22 0832  TempSrc: Temporal  PainSc: 0-No pain         Complications: No notable events documented.

## 2022-07-15 NOTE — Anesthesia Preprocedure Evaluation (Signed)
Anesthesia Evaluation  Patient identified by MRN, date of birth, ID band Patient awake    Reviewed: Allergy & Precautions, NPO status , Patient's Chart, lab work & pertinent test results  Airway Mallampati: II  TM Distance: >3 FB Neck ROM: full    Dental  (+) Teeth Intact   Pulmonary neg pulmonary ROS, sleep apnea , former smoker,    Pulmonary exam normal breath sounds clear to auscultation       Cardiovascular hypertension, negative cardio ROS Normal cardiovascular exam Rhythm:Regular     Neuro/Psych negative neurological ROS  negative psych ROS   GI/Hepatic negative GI ROS, Neg liver ROS,   Endo/Other  negative endocrine ROS  Renal/GU negative Renal ROS  negative genitourinary   Musculoskeletal   Abdominal Normal abdominal exam  (+)   Peds negative pediatric ROS (+)  Hematology negative hematology ROS (+)   Anesthesia Other Findings Past Medical History: No date: Sleep apnea     Comment:  CPAP 12-20cmH2O 02/2019: Squamous cell carcinoma of skin of face     Comment:  left cheek. Tx with topical 5FU/Calcipotriene  Past Surgical History: No date: HERNIA REPAIR     Comment:  infant No date: TONSILECTOMY, ADENOIDECTOMY, BILATERAL MYRINGOTOMY AND TUBES  BMI    Body Mass Index: 27.71 kg/m      Reproductive/Obstetrics negative OB ROS                             Anesthesia Physical Anesthesia Plan  ASA: 2  Anesthesia Plan: General   Post-op Pain Management:    Induction: Intravenous  PONV Risk Score and Plan: Propofol infusion and TIVA  Airway Management Planned: Natural Airway  Additional Equipment:   Intra-op Plan:   Post-operative Plan:   Informed Consent: I have reviewed the patients History and Physical, chart, labs and discussed the procedure including the risks, benefits and alternatives for the proposed anesthesia with the patient or authorized representative who  has indicated his/her understanding and acceptance.     Dental Advisory Given  Plan Discussed with: CRNA and Surgeon  Anesthesia Plan Comments:         Anesthesia Quick Evaluation

## 2022-07-15 NOTE — Anesthesia Postprocedure Evaluation (Signed)
Anesthesia Post Note  Patient: Henry Garrett  Procedure(s) Performed: COLONOSCOPY WITH PROPOFOL  Patient location during evaluation: PACU Anesthesia Type: General Level of consciousness: awake and oriented Pain management: satisfactory to patient Vital Signs Assessment: post-procedure vital signs reviewed and stable Respiratory status: nonlabored ventilation and respiratory function stable Anesthetic complications: no   No notable events documented.   Last Vitals:  Vitals:   07/15/22 0944 07/15/22 0954  BP: 118/78 123/83  Pulse: 72 64  Resp: 12 11  Temp:    SpO2: 98% 100%    Last Pain:  Vitals:   07/15/22 0954  TempSrc:   PainSc: 0-No pain                 VAN STAVEREN,Montague Corella

## 2022-07-15 NOTE — Op Note (Signed)
[facility] Patient Name: Henry Garrett Procedure Date: 07/15/2022 9:08 AM MRN: [id number] Account #: [id number] Date of Birth: 10/19/1966 Admit Type: Outpatient Age: 56 Room: [facility] ENDO ROOM 1 Gender: Male Note Status: Finalized Instrument Name: Jasper Riling [id number] Procedure:             Colonoscopy Indications:           Screening for colorectal malignant neoplasm Providers:             Jonathon Bellows MD, MD Referring MD:          Angela Adam. Caryl Bis (Referring MD) Medicines:             Monitored Anesthesia Care Complications:         No immediate complications. Procedure:             Pre-Anesthesia Assessment:                        - Prior to the procedure, a History and Physical was                         performed, and patient medications, allergies and                         sensitivities were reviewed. The patient's tolerance                         of previous anesthesia was reviewed.                        - The risks and benefits of the procedure and the                         sedation options and risks were discussed with the                         patient. All questions were answered and informed                         consent was obtained.                        - ASA Grade Assessment: II - A patient with mild                         systemic disease.                        After obtaining informed consent, the colonoscope was                         passed under direct vision. Throughout the procedure,                         the patient's blood pressure, pulse, and oxygen                         saturations were monitored continuously. The                         Colonoscope was introduced  through the anus and                         advanced to the the cecum, identified by the                         appendiceal orifice. The colonoscopy was performed                         with ease. The patient tolerated the procedure  well.                         The quality of the bowel preparation was excellent. Findings:      The perianal and digital rectal examinations were normal.      A 5 mm polyp was found in the ascending colon. The polyp was sessile.       The polyp was removed with a cold snare. Resection and retrieval were       complete.      Multiple small-mouthed diverticula were found in the sigmoid colon.      The exam was otherwise without abnormality on direct and retroflexion       views. Impression:            - One 5 mm polyp in the ascending colon, removed with                         a cold snare. Resected and retrieved.                        - Diverticulosis in the sigmoid colon.                        - The examination was otherwise normal on direct and                         retroflexion views. Recommendation:        - Discharge patient to home (with escort).                        - Resume previous diet.                        - Continue present medications.                        - Await pathology results.                        - Repeat colonoscopy for surveillance based on                         pathology results. Procedure Code(s):     --- Professional ---                        [phone], Colonoscopy, flexible; with removal of                         tumor(s), polyp(s), or other lesion(s) by snare  technique Diagnosis Code(s):     --- Professional ---                        Z12.11, Encounter for screening for malignant neoplasm                         of colon                        K63.5, Polyp of colon                        K57.30, Diverticulosis of large intestine without                         perforation or abscess without bleeding CPT copyright 2019 American Medical Association. All rights reserved. The codes documented in this report are preliminary and upon coder review may  be revised to meet current compliance requirements. Jonathon Bellows, MD Jonathon Bellows MD, MD 07/15/2022 9:42:02 AM This report has been signed electronically. Number of Addenda: 0 Note Initiated On: 07/15/2022 9:08 AM Scope Withdrawal Time: 0 hours 8 minutes 54 seconds  Total Procedure Duration: 0 hours 11 minutes 34 seconds  Estimated Blood Loss:  Estimated blood loss: none.      [facility]

## 2022-07-16 ENCOUNTER — Encounter: Payer: Self-pay | Admitting: Gastroenterology

## 2022-07-16 LAB — SURGICAL PATHOLOGY

## 2022-07-17 ENCOUNTER — Encounter: Payer: Self-pay | Admitting: Gastroenterology

## 2022-08-02 NOTE — Addendum Note (Signed)
Addended by: Leone Haven on: 08/02/2022 04:54 PM   Modules accepted: Orders

## 2022-08-02 NOTE — Telephone Encounter (Signed)
I have left multiple messages for the [facility] sleep lab to call office with no return call.

## 2022-09-25 ENCOUNTER — Telehealth: Payer: Self-pay

## 2022-09-25 NOTE — Telephone Encounter (Signed)
Spoke to patient and requested that he bring SD card to 09/26/2022 appt.  He voiced his understanding.  Nothing further needed.

## 2022-09-26 ENCOUNTER — Encounter: Payer: Self-pay | Admitting: Adult Health

## 2022-09-26 ENCOUNTER — Ambulatory Visit (INDEPENDENT_AMBULATORY_CARE_PROVIDER_SITE_OTHER): Payer: PRIVATE HEALTH INSURANCE | Admitting: Adult Health

## 2022-09-26 VITALS — BP 136/80 | HR 94 | Temp 97.8°F | Ht 73.0 in | Wt 228.8 lb

## 2022-09-26 DIAGNOSIS — G4733 Obstructive sleep apnea (adult) (pediatric): Secondary | ICD-10-CM

## 2022-09-26 NOTE — Progress Notes (Signed)
$'@Patient'E$  ID: Henry Garrett, male    DOB: May 28, 1966, 56 y.o.   MRN: [id number]  No chief complaint on file.   Referring provider: Leone Haven, MD  HPI: 56 year old male seen for sleep consult to establish for sleep apnea  TEST/EVENTS :  Home sleep study June 2014 showed severe sleep apnea with AHI at 32.3/hour.  09/26/2022 Sleep consult  Patient presents for sleep consult to establish for sleep apnea.  Patient says he has had sleep apnea for almost 10 years.  Is been on CPAP.  Says he uses his CPAP every single night cannot sleep without it.  Feels that he benefits from CPAP with decreased daytime sleepiness.  Patient says he is pretty much been managing his CPAP on his own.  He says he gets his supplies to the [employer].  However his machine is getting old and needs a new CPAP machine.  It is sending a message in his life expectancy.  Typically goes to bed between 9 and 11 PM.  Takes only a few minutes to go to sleep.  Is up around 5 AM.  Has no significant daytime sleepiness.  Has minimal caffeine intake.  No history of congestive heart failure or stroke.  Patient does have peripheral neuropathy and essential tremor.  Patient wants to try the DreamWear full facemask.  Epworth score is 1 out of 24.  Gets mildly sleepy with watching TV.  CPAP download shows excellent compliance with 100% usage.  Daily average usage at 8 hours.  He is on auto CPAP 10 to 20 cm of H2O.  AHI is 1.3/hour.  Currently using the [facility] air fullface mask.  Social history patient is married.  Lives at home with his wife.  Has adult children.  He quit smoking in 2003.  No alcohol.  And no drug use history.  Family history is negative.  Medical history significant for hypertension, hyperlipidemia, sleep apnea, essential tremor.  Peripheral neuropathy and MGUS No Known Allergies  Immunization History  Administered Date(s) Administered   Influenza,inj,Quad PF,6+ Mos 11/11/2014, 07/07/2015, 06/28/2016,  10/02/2018   Tdap 03/17/2010    Past Medical History:  Diagnosis Date   Sleep apnea    CPAP 12-20cmH2O   Squamous cell carcinoma of skin of face 02/2019   left cheek. Tx with topical 5FU/Calcipotriene    Tobacco History: Social History   Tobacco Use  Smoking Status Former   Types: Cigarettes   Quit date: 03/17/2000   Years since quitting: 22.5  Smokeless Tobacco Never   Counseling given: Not Answered   Outpatient Medications Prior to Visit  Medication Sig Dispense Refill   Alpha-Lipoic Acid 600 MG CAPS Take by mouth.     folic acid (FOLVITE) 1 MG tablet Take 1 mg by mouth daily.     triamcinolone cream (KENALOG) 0.1 % Apply 1 Application topically 2 (two) times daily. For 7-14 days. 30 g 0   vitamin B-12 (CYANOCOBALAMIN) 100 MCG tablet Take 100 mcg by mouth daily.     No facility-administered medications prior to visit.     Review of Systems:   Constitutional:   No  weight loss, night sweats,  Fevers, chills, fatigue, or  lassitude.  HEENT:   No headaches,  Difficulty swallowing,  Tooth/dental problems, or  Sore throat,                No sneezing, itching, ear ache, nasal congestion, post nasal drip,   CV:  No chest pain,  Orthopnea, PND, swelling in  lower extremities, anasarca, dizziness, palpitations, syncope.   GI  No heartburn, indigestion, abdominal pain, nausea, vomiting, diarrhea, change in bowel habits, loss of appetite, bloody stools.   Resp: No shortness of breath with exertion or at rest.  No excess mucus, no productive cough,  No non-productive cough,  No coughing up of blood.  No change in color of mucus.  No wheezing.  No chest wall deformity  Skin: no rash or lesions.  GU: no dysuria, change in color of urine, no urgency or frequency.  No flank pain, no hematuria   MS:  No joint pain or swelling.  No decreased range of motion.  No back pain.    Physical Exam    GEN: A/Ox3; pleasant , NAD, well nourished    HEENT:  [place]/AT,  NOSE-clear,  THROAT-clear, no lesions, no postnasal drip or exudate noted.  Class II-III MP airway  NECK:  Supple w/ fair ROM; no JVD; normal carotid impulses w/o bruits; no thyromegaly or nodules palpated; no lymphadenopathy.    RESP  Clear  P & A; w/o, wheezes/ rales/ or rhonchi. no accessory muscle use, no dullness to percussion  CARD:  RRR, no m/r/g, no peripheral edema, pulses intact, no cyanosis or clubbing.  GI:   Soft & nt; nml bowel sounds; no organomegaly or masses detected.   Musco: Warm bil, no deformities or joint swelling noted.   Neuro: alert, no focal deficits noted.    Skin: Warm, no lesions or rashes    Lab Results:  CBC    Component Value Date/Time   WBC 7.2 05/20/2022 0908   RBC 4.68 05/20/2022 0908   HGB 13.8 05/20/2022 0908   HCT 40.8 05/20/2022 0908   PLT 268.0 05/20/2022 0908   MCV 87.4 05/20/2022 0908   MCH 28.8 04/16/2019 1452   MCHC 33.8 05/20/2022 0908   RDW 13.2 05/20/2022 0908   LYMPHSABS 1.7 05/20/2022 0908   MONOABS 0.8 05/20/2022 0908   EOSABS 0.1 05/20/2022 0908   BASOSABS 0.1 05/20/2022 0908    BMET    Component Value Date/Time   NA 137 05/20/2022 0908   K 3.9 05/20/2022 0908   CL 102 05/20/2022 0908   CO2 27 05/20/2022 0908   GLUCOSE 91 05/20/2022 0908   BUN 13 05/20/2022 0908   CREATININE 0.86 05/20/2022 0908   CALCIUM 9.2 05/20/2022 0908   GFRNONAA >60 04/16/2019 1452   GFRAA >60 04/16/2019 1452    BNP No results found for: "BNP"  ProBNP No results found for: "PROBNP"  Imaging: No results found.        No data to display          No results found for: "NITRICOXIDE"      Assessment & Plan:   No problem-specific Assessment & Plan notes found for this encounter.     Rexene Edison, NP 09/26/2022

## 2022-09-26 NOTE — Patient Instructions (Addendum)
Continue on CPAP At bedtime   Keep up good work .  Do not drive if sleepy  Work on healthy weight  Try Dream wear full face Order for new CPAP machine.  Follow up in 6 months with Dr. Mortimer Fries or Ebany Bowermaster NP and As needed

## 2022-09-27 NOTE — Assessment & Plan Note (Signed)
History of sleep apnea dating back to 2014.  Patient has excellent control compliance on CPAP.  Says he cannot sleep without it.  Has perceived clinical benefit.  We will order new CPAP machine.  As his is almost 55 years old and has met his life expectancy.  Patient would like to switch to the DreamWear full facemask.  Order has been placed to [place].  - discussed how weight can impact sleep and risk for sleep disordered breathing - discussed options to assist with weight loss: combination of diet modification, cardiovascular and strength training exercises   - had an extensive discussion regarding the adverse health consequences related to untreated sleep disordered breathing - specifically discussed the risks for hypertension, coronary artery disease, cardiac dysrhythmias, cerebrovascular disease, and diabetes - lifestyle modification discussed   - discussed how sleep disruption can increase risk of accidents, particularly when driving - safe driving practices were discussed   Plan  Patient Instructions  Continue on CPAP At bedtime   Keep up good work .  Do not drive if sleepy  Work on healthy weight  Try Dream wear full face Order for new CPAP machine.  Follow up in 6 months with Dr. Mortimer Fries or Evelyn Aguinaldo NP and As needed

## 2022-09-30 NOTE — Progress Notes (Signed)
Reviewed and agree with assessment/plan.   Chesley Mires, MD [facility] Pulmonary/Critical Care 09/30/2022, 8:07 AM Pager:  [phone]

## 2022-11-20 ENCOUNTER — Encounter: Payer: Self-pay | Admitting: Family Medicine

## 2022-11-20 ENCOUNTER — Ambulatory Visit (INDEPENDENT_AMBULATORY_CARE_PROVIDER_SITE_OTHER): Payer: PRIVATE HEALTH INSURANCE | Admitting: Family Medicine

## 2022-11-20 VITALS — BP 130/70 | HR 84 | Temp 97.9°F | Ht 73.0 in | Wt 223.2 lb

## 2022-11-20 DIAGNOSIS — G63 Polyneuropathy in diseases classified elsewhere: Secondary | ICD-10-CM

## 2022-11-20 DIAGNOSIS — D472 Monoclonal gammopathy: Secondary | ICD-10-CM | POA: Diagnosis not present

## 2022-11-20 DIAGNOSIS — I10 Essential (primary) hypertension: Secondary | ICD-10-CM | POA: Diagnosis not present

## 2022-11-20 DIAGNOSIS — R21 Rash and other nonspecific skin eruption: Secondary | ICD-10-CM

## 2022-11-20 DIAGNOSIS — R7303 Prediabetes: Secondary | ICD-10-CM

## 2022-11-20 DIAGNOSIS — G4733 Obstructive sleep apnea (adult) (pediatric): Secondary | ICD-10-CM

## 2022-11-20 LAB — POCT GLYCOSYLATED HEMOGLOBIN (HGB A1C): Hemoglobin A1C: 6 % — AB (ref 4.0–5.6)

## 2022-11-20 MED ORDER — TRIAMCINOLONE ACETONIDE 0.1 % EX CREA: 1.0000 | TOPICAL_CREAM | Freq: Two times a day (BID) | CUTANEOUS | 0 refills | Status: DC

## 2022-11-20 NOTE — Assessment & Plan Note (Addendum)
Chronic issue.  Patient reports he got his new CPAP and has been doing well with this.

## 2022-11-20 NOTE — Assessment & Plan Note (Addendum)
Chronic issue.  Encouraged getting back to healthier diet.  Discussed adding in some exercise on top of what he does on a daily basis.

## 2022-11-20 NOTE — Assessment & Plan Note (Signed)
Chronic issue.  Adequately controlled.  Will continue to monitor.

## 2022-11-20 NOTE — Assessment & Plan Note (Signed)
Patient will continue to see hematology.

## 2022-11-20 NOTE — Assessment & Plan Note (Signed)
Patient notes his neuropathy is stable.  He will continue to see neurology and his hematologist.

## 2022-11-20 NOTE — Progress Notes (Signed)
Tommi Rumps, MD Phone: [phone]  Henry Garrett is a 57 y.o. male who presents today for f/u.  HYPERTENSION Disease Monitoring Home BP Monitoring not checking Chest pain- no    Dyspnea- no Medications Compliance-  taking no medications.   Edema- no BMET    Component Value Date/Time   NA 137 05/20/2022 0908   K 3.9 05/20/2022 0908   CL 102 05/20/2022 0908   CO2 27 05/20/2022 0908   GLUCOSE 91 05/20/2022 0908   BUN 13 05/20/2022 0908   CREATININE 0.86 05/20/2022 0908   CALCIUM 9.2 05/20/2022 0908   GFRNONAA >60 04/16/2019 1452   GFRAA >60 04/16/2019 1452   Prediabetes: Patient notes his diet was not good from Halloween until New Year's.  He does not do any specific exercise though he does get 12000-15000 steps daily.  MGUS: Patient has seen hematology at [facility].  He sees him again next week.  He notes no bone pain.  His neuropathy is stable.  Skin lesion: Patient notes this is on the dorsum of his right hand near his pinky knuckle.  It has been present for several months.  No pain or bleeding.  He is unsure if it is getting larger.  Social History   Tobacco Use  Smoking Status Former   Packs/day: 1.00   Years: 15.00   Total pack years: 15.00   Types: Cigarettes   Quit date: 03/17/2000   Years since quitting: 22.6  Smokeless Tobacco Never    Current Outpatient Medications on File Prior to Visit  Medication Sig Dispense Refill   primidone (MYSOLINE) 50 MG tablet Take 50 mg by mouth 4 (four) times daily.     No current facility-administered medications on file prior to visit.     ROS see history of present illness  Objective  Physical Exam Vitals:   11/20/22 0813  BP: 130/70  Pulse: 84  Temp: 97.9 F (36.6 C)  SpO2: 99%    BP Readings from Last 3 Encounters:  11/20/22 130/70  09/26/22 136/80  07/15/22 123/83   Wt Readings from Last 3 Encounters:  11/20/22 223 lb 3.2 oz (101.2 kg)  09/26/22 228 lb 12.8 oz (103.8 kg)  07/15/22 210 lb  (95.3 kg)    Physical Exam Constitutional:      General: He is not in acute distress.    Appearance: He is not diaphoretic.  Cardiovascular:     Rate and Rhythm: Normal rate and regular rhythm.     Heart sounds: Normal heart sounds.  Pulmonary:     Effort: Pulmonary effort is normal.     Breath sounds: Normal breath sounds.  Skin:    General: Skin is warm and dry.       Neurological:     Mental Status: He is alert.      Assessment/Plan: Please see individual problem list.  Essential hypertension, benign Assessment & Plan: Chronic issue.  Adequately controlled.  Will continue to monitor.   OSA (obstructive sleep apnea) Assessment & Plan: Chronic issue.  Patient reports he got his new CPAP and has been doing well with this.   Neuropathy with IgM monoclonal gammopathy [facility]) Assessment & Plan: Patient notes his neuropathy is stable.  He will continue to see neurology and his hematologist.   Rash Assessment & Plan: Undetermined cause of the rash on his hand.  I will give him a trial of triamcinolone 0.1% twice daily.  We will refer to dermatology.  Discussed if the triamcinolone is not beneficial over  the next week he can discontinue use.  Orders: -     Triamcinolone Acetonide; Apply 1 Application topically 2 (two) times daily.  Dispense: 30 g; Refill: 0 -     Ambulatory referral to Dermatology  Prediabetes Assessment & Plan: Chronic issue.  Encouraged getting back to healthier diet.  Discussed adding in some exercise on top of what he does on a daily basis.  Orders: -     POCT glycosylated hemoglobin (Hb A1C)  MGUS (monoclonal gammopathy of unknown significance) Assessment & Plan: Patient will continue to see hematology.     Return in about 6 months (around 05/21/2023) for physical.   Tommi Rumps, MD [place]

## 2022-11-20 NOTE — Assessment & Plan Note (Signed)
Undetermined cause of the rash on his hand.  I will give him a trial of triamcinolone 0.1% twice daily.  We will refer to dermatology.  Discussed if the triamcinolone is not beneficial over the next week he can discontinue use.

## 2023-01-01 ENCOUNTER — Encounter: Payer: Self-pay | Admitting: Dermatology

## 2023-01-01 ENCOUNTER — Ambulatory Visit (INDEPENDENT_AMBULATORY_CARE_PROVIDER_SITE_OTHER): Payer: PRIVATE HEALTH INSURANCE | Admitting: Dermatology

## 2023-01-01 VITALS — BP 144/85 | HR 87

## 2023-01-01 DIAGNOSIS — L409 Psoriasis, unspecified: Secondary | ICD-10-CM | POA: Diagnosis not present

## 2023-01-01 DIAGNOSIS — Z86007 Personal history of in-situ neoplasm of skin: Secondary | ICD-10-CM | POA: Diagnosis not present

## 2023-01-01 DIAGNOSIS — Z79899 Other long term (current) drug therapy: Secondary | ICD-10-CM

## 2023-01-01 DIAGNOSIS — M721 Knuckle pads: Secondary | ICD-10-CM | POA: Diagnosis not present

## 2023-01-01 DIAGNOSIS — L720 Epidermal cyst: Secondary | ICD-10-CM

## 2023-01-01 MED ORDER — OTEZLA 30 MG PO TABS: 30.0000 mg | ORAL_TABLET | Freq: Two times a day (BID) | ORAL | 2 refills | Status: DC

## 2023-01-01 MED ORDER — CLOBETASOL PROPIONATE 0.05 % EX OINT: TOPICAL_OINTMENT | CUTANEOUS | 2 refills | Status: DC

## 2023-01-01 MED ORDER — ZORYVE 0.3 % EX CREA: TOPICAL_CREAM | CUTANEOUS | 2 refills | Status: DC

## 2023-01-01 NOTE — Progress Notes (Signed)
New Patient Visit  Subjective  Henry Garrett is a 57 y.o. male who presents for the following: Skin Problem (Lesion at R-5 base of pinky. Dur: several months. PCP gave cream [facility]), no help. Spot on back of scalp) and Rash (States has psoriasis on palms for years. Cracks and bleeds at times. States he was diagnosed with psoriasis by Dr. Koleen Nimrod several years ago).  The patient has spots, moles and lesions to be evaluated, some may be new or changing and the patient has concerns that these could be cancer.  Review of Systems: No other skin or systemic complaints except as noted in HPI or Assessment and Plan.   Objective  Well appearing patient in no apparent distress; mood and affect are within normal limits.  A focused examination was performed including scalp, palms, hands. Relevant physical exam findings are noted in the Assessment and Plan.  Right 5th Finger Metacarpophalangeal Joint Firm flesh plaque with mild hyperkeratosis c/w thickened skin  Right Occipital Scalp, left cheek 1.2 cm firm subcutaneous nodule with overlying erythema at right occipital scalp.  1.2 cm firm subcutaneous nodule at left cheek.  B/L palms Erythema with hyperkeratosis at B/L palms, some at web spaces, lateral fingers.  Elbows clear.   Assessment & Plan   History of Squamous Cell Carcinoma IS of the Skin, treated with 5FU - No evidence of recurrence today - Recommend regular full body skin exams - Recommend daily broad spectrum sunscreen SPF 30+ to sun-exposed areas, reapply every 2 hours as needed.  - Call if any new or changing lesions are noted between office visits    Knuckle pad of right hand Right 5th Finger Metacarpophalangeal Joint  Vs psoriasis  Recommend starting moisturizer with exfoliant (Urea, Salicylic acid, or Lactic acid) one to two times daily to help smooth rough and bumpy skin.  OTC options include Cetaphil Rough and Bumpy lotion (Urea), Eucerin Roughness Relief  lotion or spot treatment cream (Urea), CeraVe SA lotion/cream for Rough and Bumpy skin (Sal Acid), Gold Bond Rough and Bumpy cream (Sal Acid), and AmLactin 12% lotion/cream (Lactic Acid).  If applying in morning, also apply sunscreen to sun-exposed areas, since these exfoliating moisturizers can increase sensitivity to sun.  Start Clobetasol ointment at bedtime to aas hands, spot treat area on right hand once daily.  Topical steroids (such as triamcinolone, fluocinolone, fluocinonide, mometasone, clobetasol, halobetasol, betamethasone, hydrocortisone) can cause thinning and lightening of the skin if they are used for too long in the same area. Your physician has selected the right strength medicine for your problem and area affected on the body. Please use your medication only as directed by your physician to prevent side effects.    clobetasol ointment (TEMOVATE) 0.05 % - Right 5th Finger Metacarpophalangeal Joint Apply to hands at bedtime  Epidermal cyst Right Occipital Scalp, left cheek  Benign-appearing. Exam most consistent with an epidermal inclusion cyst. Discussed that a cyst is a benign growth that can grow over time and sometimes get irritated or inflamed. Recommend observation if it is not bothersome. Discussed option of surgical excision to remove it if it is growing, symptomatic, or other changes noted. Please call for new or changing lesions so they can be evaluated.  Since R occipital scalp cyst is symptomatic, recommend he apply clobetasol to aa at bedtime prn itch.  May excise at a later date if not improving.  Psoriasis B/L palms  Vs hand dermatitis. Chronic and persistent condition with duration or expected duration over one year.  Condition is bothersome/symptomatic for patient. Currently flared.  Has arthritis in right thumb.  Since has been going on for several years recommend systemic treatment. Has blood condition: MGUS.   Counseling on psoriasis and coordination of care   psoriasis is a chronic non-curable, but treatable genetic/hereditary disease that may have other systemic features affecting other organ systems such as joints (Psoriatic Arthritis). It is associated with an increased risk of inflammatory bowel disease, heart disease, non-alcoholic fatty liver disease, and depression.  Treatments include light and laser treatments; topical medications; and systemic medications including oral and injectables.   Start Zoryve cream apply in morning to hands. Samples given Start Clobetasol ointment at bedtime to aas hands, avoid hand dorsa due to risk skin thinning Start Otezla 30 mg PO bid, samples packs given  Side effects of Otezla (apremilast) include diarrhea, nausea, headache, upper respiratory infection, depression, and weight decrease (5-10%). It should only be taken by pregnant women after a discussion regarding risks and benefits with their doctor. Goal is control of skin condition, not cure.  The use of [place] Nail requires long term medication management, including periodic office visits.       Roflumilast (ZORYVE) 0.3 % CREA - B/L palms Apply in morning to hands  Apremilast (OTEZLA) 30 MG TABS - B/L palms Take 1 tablet (30 mg total) by mouth 2 (two) times daily.   Return in about 1 month (around 02/01/2023) for Psoriasis Follow Up.  I, Emelia Salisbury, CMA, am acting as scribe for Brendolyn Patty, MD.  Documentation: I have reviewed the above documentation for accuracy and completeness, and I agree with the above.  Brendolyn Patty MD

## 2023-01-01 NOTE — Patient Instructions (Addendum)
Recommend starting moisturizer with exfoliant (Urea, Salicylic acid, or Lactic acid) one to two times daily to help smooth rough and bumpy skin.  OTC options include Cetaphil Rough and Bumpy lotion (Urea), Eucerin Roughness Relief lotion or spot treatment cream (Urea), CeraVe SA lotion/cream for Rough and Bumpy skin (Sal Acid), Gold Bond Rough and Bumpy cream (Sal Acid), and AmLactin 12% lotion/cream (Lactic Acid).  If applying in morning, also apply sunscreen to sun-exposed areas, since these exfoliating moisturizers can increase sensitivity to sun.   Start Clobetasol ointment at bedtime to hands, spot treat area on right hand once daily.  Zoryve cream apply in morning to hands.   Start [place] as directed on package.  Your prescription for Anner Crete was sent to [facility] in [place]. A representative from [facility] will contact you within 3 business hours to verify your address and insurance information to schedule a free delivery. If for any reason you do not receive a phone call from them, please reach out to them. Their phone number is [phone] and their hours are Monday-Friday 9:00 am-5:00 pm.    Topical steroids (such as triamcinolone, fluocinolone, fluocinonide, mometasone, clobetasol, halobetasol, betamethasone, hydrocortisone) can cause thinning and lightening of the skin if they are used for too long in the same area. Your physician has selected the right strength medicine for your problem and area affected on the body. Please use your medication only as directed by your physician to prevent side effects.   Side effects of Otezla (apremilast) include diarrhea, nausea, headache, upper respiratory infection, depression, and weight decrease (5-10%). It should only be taken by pregnant women after a discussion regarding risks and benefits with their doctor. Goal is control of skin condition, not cure.  The use of R[place]ford Nail requires long term medication management, including periodic  office visits.   Gentle Skin Care Guide  1. Bathe no more than once a day.  2. Avoid bathing in hot water  3. Use a mild soap like Dove, Vanicream, Cetaphil, CeraVe. Can use Lever 2000 or Cetaphil antibacterial soap  4. Use soap only where you need it. On most days, use it under your arms, between your legs, and on your feet. Let the water rinse other areas unless visibly dirty.  5. When you get out of the bath/shower, use a towel to gently blot your skin dry, don't rub it.  6. While your skin is still a little damp, apply a moisturizing cream such as Vanicream, CeraVe, Cetaphil, Eucerin, Sarna lotion or plain Vaseline Jelly. For hands apply Neutrogena [place] Hand Cream or Excipial Hand Cream.  7. Reapply moisturizer any time you start to itch or feel dry.  8. Sometimes using free and clear laundry detergents can be helpful. Fabric softener sheets should be avoided. Downy Free & Gentle liquid, or any liquid fabric softener that is free of dyes and perfumes, it acceptable to use  9. If your doctor has given you prescription creams you may apply moisturizers over them    Due to recent changes in healthcare laws, you may see results of your pathology and/or laboratory studies on MyChart before the doctors have had a chance to review them. We understand that in some cases there may be results that are confusing or concerning to you. Please understand that not all results are received at the same time and often the doctors may need to interpret multiple results in order to provide you with the best plan of care or course of treatment. Therefore, we ask  that you please give [place] 2 business days to thoroughly review all your results before contacting the office for clarification. Should we see a critical lab result, you will be contacted sooner.   If You Need Anything After Your Visit  If you have any questions or concerns for your doctor, please call our main line at [phone] and press  option 4 to reach your doctor's medical assistant. If no one answers, please leave a voicemail as directed and we will return your call as soon as possible. Messages left after 4 pm will be answered the following business day.   You may also send [place] a message via [place]. We typically respond to MyChart messages within 1-2 business days.  For prescription refills, please ask your pharmacy to contact our office. Our fax number is [phone].  If you have an urgent issue when the clinic is closed that cannot wait until the next business day, you can page your doctor at the number below.    Please note that while we do our best to be available for urgent issues outside of office hours, we are not available 24/7.   If you have an urgent issue and are unable to reach [place], you may choose to seek medical care at your doctor's office, retail clinic, urgent care center, or emergency room.  If you have a medical emergency, please immediately call 911 or go to the emergency department.  Pager Numbers  - Dr. Nehemiah Massed: [phone]  - Dr. Laurence Ferrari: [phone]  - Dr. Nicole Kindred: [phone]  In the event of inclement weather, please call our main line at [phone] for an update on the status of any delays or closures.  Dermatology Medication Tips: Please keep the boxes that topical medications come in in order to help keep track of the instructions about where and how to use these. Pharmacies typically print the medication instructions only on the boxes and not directly on the medication tubes.   If your medication is too expensive, please contact our office at [phone] option 4 or send [place] a message through [place].   We are unable to tell what your co-pay for medications will be in advance as this is different depending on your insurance coverage. However, we may be able to find a substitute medication at lower cost or fill out paperwork to get insurance to cover a needed medication.   If a  prior authorization is required to get your medication covered by your insurance company, please allow [place] 1-2 business days to complete this process.  Drug prices often vary depending on where the prescription is filled and some pharmacies may offer cheaper prices.  The website www.goodrx.com contains coupons for medications through different pharmacies. The prices here do not account for what the cost may be with help from insurance (it may be cheaper with your insurance), but the website can give you the price if you did not use any insurance.  - You can print the associated coupon and take it with your prescription to the pharmacy.  - You may also stop by our office during regular business hours and pick up a GoodRx coupon card.  - If you need your prescription sent electronically to a different pharmacy, notify our office through [facility] or by phone at [phone] option 4.     Si Usted Necesita Algo Despus de Su Visita  Tambin puede enviarnos un mensaje a travs de Pharmacist, community. Por lo general respondemos a los Counsellor  transcurso de 1 a 2 das hbiles.  Para renovar recetas, por favor pida a su farmacia que se ponga en contacto con nuestra oficina. Harland Dingwall de fax es [place] [phone].  Si tiene un asunto urgente cuando la clnica est cerrada y que no puede esperar hasta el siguiente da hbil, puede llamar/localizar a su doctor(a) al nmero que aparece a continuacin.   Por favor, tenga en cuenta que aunque hacemos todo lo posible para estar disponibles para asuntos urgentes fuera del horario de [place], no estamos disponibles las 24 horas del da, los 7 das de la [place].   Si tiene un problema urgente y no puede comunicarse con nosotros, puede optar por buscar atencin mdica  en el consultorio de su doctor(a), en una clnica privada, en un centro de atencin urgente o en una sala de emergencias.  Si tiene Engineering geologist, por favor llame  inmediatamente al 911 o vaya a la sala de emergencias.  Nmeros de bper  - Dr. Nehemiah Massed: [phone]  - Dra. Moye: [phone]  - Dra. Nicole Kindred: [phone]  En caso de inclemencias del [place], por favor llame a Johnsie Kindred principal al [phone] para una actualizacin sobre el [place] de cualquier retraso o cierre.  Consejos para la medicacin en dermatologa: Por favor, guarde las cajas en las que vienen los medicamentos de uso tpico para ayudarle a seguir las instrucciones sobre dnde y cmo usarlos. Las farmacias generalmente imprimen las instrucciones del medicamento slo en las cajas y no directamente en los tubos del [place].   Si su medicamento es muy caro, por favor, pngase en contacto con Zigmund Daniel llamando al [phone] y presione la opcin 4 o envenos un mensaje a travs de Pharmacist, community.   No podemos decirle cul ser su copago por los medicamentos por adelantado ya que esto es diferente dependiendo de la cobertura de su seguro. Sin embargo, es posible que podamos encontrar un medicamento sustituto a Electrical engineer un formulario para que el seguro cubra el medicamento que se considera necesario.   Si se requiere una autorizacin previa para que su compaa de seguros [place] su medicamento, por favor permtanos de 1 a 2 das hbiles para completar este proceso.  Los precios de los medicamentos varan con frecuencia dependiendo del Environmental consultant de dnde se surte la receta y alguna farmacias pueden ofrecer precios ms baratos.  El sitio web www.goodrx.com tiene cupones para medicamentos de Airline pilot. Los precios aqu no tienen en cuenta lo que podra costar con la ayuda del seguro (puede ser ms barato con su seguro), pero el sitio web puede darle el precio si no utiliz Research scientist (physical sciences).  - Puede imprimir el cupn correspondiente y llevarlo con su receta a la farmacia.  - Tambin puede pasar por nuestra oficina durante el horario de atencin regular y Charity fundraiser  una tarjeta de cupones de GoodRx.  - Si necesita que su receta se enve electrnicamente a una farmacia diferente, informe a nuestra oficina a travs de MyChart de [place] o por telfono llamando al [phone] y presione la opcin 4.

## 2023-02-05 ENCOUNTER — Ambulatory Visit (INDEPENDENT_AMBULATORY_CARE_PROVIDER_SITE_OTHER): Payer: PRIVATE HEALTH INSURANCE | Admitting: Dermatology

## 2023-02-05 VITALS — BP 133/75 | HR 83

## 2023-02-05 DIAGNOSIS — Z79899 Other long term (current) drug therapy: Secondary | ICD-10-CM

## 2023-02-05 DIAGNOSIS — L409 Psoriasis, unspecified: Secondary | ICD-10-CM | POA: Diagnosis not present

## 2023-02-05 NOTE — Patient Instructions (Addendum)
Counseling on psoriasis and coordination of care  psoriasis is a chronic non-curable, but treatable genetic/hereditary disease that may have other systemic features affecting other organ systems such as joints (Psoriatic Arthritis). It is associated with an increased risk of inflammatory bowel disease, heart disease, non-alcoholic fatty liver disease, and depression.  Treatments include light and laser treatments; topical medications; and systemic medications including oral and injectables.   Side effects of Otezla (apremilast) include diarrhea, nausea, headache, upper respiratory infection, depression, and weight decrease (5-10%). It should only be taken by pregnant women after a discussion regarding risks and benefits with their doctor. Goal is control of skin condition, not cure.  The use of Henderson Baltimore[place]tezla requires long term medication management, including periodic office visits.  Topical steroids (such as triamcinolone, fluocinolone, fluocinonide, mometasone, clobetasol, halobetasol, betamethasone, hydrocortisone) can cause thinning and lightening of the skin if they are used for too long in the same area. Your physician has selected the right strength medicine for your problem and area affected on the body. Please use your medication only as directed by your physician to prevent side effects.    Due to recent changes in healthcare laws, you may see results of your pathology and/or laboratory studies on MyChart before the doctors have had a chance to review them. We understand that in some cases there may be results that are confusing or concerning to you. Please understand that not all results are received at the same time and often the doctors may need to interpret multiple results in order to provide you with the best plan of care or course of treatment. Therefore, we ask that you please give us 2 business days to thoroughly review all your results before contacting the office for clarification. Should we  see a critical lab result, you will be contacted sooner.   If You Need Anything After Your Visit  If you have any questions or concerns for your doctor, please call our main line at [phone] and press option 4 to reach your doctor's medical assistant. If no one answers, please leave a voicemail as directed and we will return your call as soon as possible. Messages left after 4 pm will be answered the following business day.   You may also send us a message via MyChart. We typically respond to MyChart messages within 1-2 business days.  For prescription refills, please ask your pharmacy to contact our office. Our fax number is [phone].  If you have an urgent issue when the clinic is closed that cannot wait until the next business day, you can page your doctor at the number below.    Please note that while we do our best to be available for urgent issues outside of office hours, we are not available 24/7.   If you have an urgent issue and are unable to reach us, you may choose to seek medical care at your doctor's office, retail clinic, urgent care center, or emergency room.  If you have a medical emergency, please immediately call 911 or go to the emergency department.  Pager Numbers  - Dr. Gwen PoundsKowalski: [phone]  - Dr. Neale BurlyMoye: [phone]  - Dr. Roseanne RenoStewart: [phone]  In the event of inclement weather, please call our main line at [phone] for an update on the status of any delays or closures.  Dermatology Medication Tips: Please keep the boxes that topical medications come in in order to help keep track of the instructions about where and how to use these. Pharmacies typically print the medication  instructions only on the boxes and not directly on the medication tubes.   If your medication is too expensive, please contact our office at [phone] option 4 or send [place] a message through MyChart.   We are unable to tell what your co-pay for medications will be in advance  as this is different depending on your insurance coverage. However, we may be able to find a substitute medication at lower cost or fill out paperwork to get insurance to cover a needed medication.   If a prior authorization is required to get your medication covered by your insurance company, please allow [place] 1-2 business days to complete this process.  Drug prices often vary depending on where the prescription is filled and some pharmacies may offer cheaper prices.  The website www.goodrx.com contains coupons for medications through different pharmacies. The prices here do not account for what the cost may be with help from insurance (it may be cheaper with your insurance), but the website can give you the price if you did not use any insurance.  - You can print the associated coupon and take it with your prescription to the pharmacy.  - You may also stop by our office during regular business hours and pick up a GoodRx coupon card.  - If you need your prescription sent electronically to a different pharmacy, notify our office through [facility] or by phone at [phone] option 4.     Si Usted Necesita Algo Despus de Su Visita  Tambin puede enviarnos un mensaje a travs de Clinical cytogeneticist. Por lo general respondemos a los mensajes de MyChart en el transcurso de 1 a 2 das hbiles.  Para renovar recetas, por favor pida a su farmacia que se ponga en contacto con nuestra oficina. Annie Sable de fax es [place] [phone].  Si tiene un asunto urgente cuando la clnica est cerrada y que no puede esperar hasta el siguiente da hbil, puede llamar/localizar a su doctor(a) al nmero que aparece a continuacin.   Por favor, tenga en cuenta que aunque hacemos todo lo posible para estar disponibles para asuntos urgentes fuera del horario de [place], no estamos disponibles las 24 horas del da, los 7 [address]  [address] de la [place].   Si tiene un problema urgente y no puede comunicarse con nosotros, puede optar  por buscar atencin mdica  en el consultorio de su doctor(a), en una clnica privada, en un centro de atencin urgente o en una sala de emergencias.  Si tiene Engineer, drilling, por favor llame inmediatamente al 911 o vaya a la sala de emergencias.  Nmeros de bper  - Dr. Gwen Pounds: [phone]  - Dra. Moye: [phone]  - Dra. Roseanne Reno: [phone]  En caso de inclemencias del [place], por favor llame a Lacy Duverney principal al [phone] para una actualizacin sobre el [place] de cualquier retraso o cierre.  Consejos para la medicacin en dermatologa: Por favor, guarde las cajas en las que vienen los medicamentos de uso tpico para ayudarle a seguir las instrucciones sobre dnde y cmo usarlos. Las farmacias generalmente imprimen las instrucciones del medicamento slo en las cajas y no directamente en los tubos del [place].   Si su medicamento es muy caro, por favor, pngase en contacto con Rolm Gala llamando al [phone] y presione la opcin 4 o envenos un mensaje a travs de Clinical cytogeneticist.   No podemos decirle cul ser su copago por los medicamentos por adelantado ya que esto es diferente dependiendo de la cobertura de su seguro. Sin embargo,  es posible que podamos encontrar un medicamento sustituto a Audiological scientist un formulario para que el seguro cubra el medicamento que se considera necesario.   Si se requiere una autorizacin previa para que su compaa de seguros [place] su medicamento, por favor permtanos de 1 a 2 das hbiles para completar [address]  Los precios de los medicamentos varan con frecuencia dependiendo del Environmental consultant de dnde se surte la receta y alguna farmacias pueden ofrecer precios ms baratos.  El sitio web www.goodrx.com tiene cupones para medicamentos de Health and safety inspector. Los precios aqu no tienen en cuenta lo que podra costar con la ayuda del seguro (puede ser ms barato con su seguro), pero el sitio web puede darle el precio si  no utiliz Tourist information centre manager.  - Puede imprimir el cupn correspondiente y llevarlo con su receta a la farmacia.  - Tambin puede pasar por nuestra oficina durante el horario de atencin regular y Education officer, museum una tarjeta de cupones de GoodRx.  - Si necesita que su receta se enve electrnicamente a una farmacia diferente, informe a nuestra oficina a travs de MyChart de [place] o por telfono llamando al [phone] y presione la opcin 4.

## 2023-02-05 NOTE — Progress Notes (Signed)
   Follow-Up Visit   Subjective  Henry Garrett is a 57 y.o. male who presents for the following: Psoriasis vs Hand Dermatitis of the hands, improving with Otezla 30mg  BID, Clobetasol ointment, and Zoryve Cream. Best that he has ever been.  Also joints aren't as sore.  No GI upset from the [place].    The following portions of the chart were reviewed this encounter and updated as appropriate: medications, allergies, medical history  Review of Systems:  No other skin or systemic complaints except as noted in HPI or Assessment and Plan.  Objective  Well appearing patient in no apparent distress; mood and affect are within normal limits.  Areas Examined: hands  Relevant exam findings are noted in the Assessment and Plan.      Assessment & Plan     PSORIASIS  Light pink firm papules with thinning on the right hand dorsum. Mild erythema on the palms. Elbows clear.   Chronic condition with duration or expected duration over one year. Currently well-controlled on [place]. Has arthritis in right thumb also with improvement on [place]. Has blood condition: MGUS.    Treatment Plan:  Continue Zoryve cream apply in morning to hands.  Continue Otezla 30 mg PO bid. Continue Clobetasol ointment qhs but only spot treating more severe thickened areas. avoid rubbing over hand dorsa due to risk skin thinning.  Side effects of Otezla (apremilast) include diarrhea, nausea, headache, upper respiratory infection, depression, and weight decrease (5-10%). It should only be taken by pregnant women after a discussion regarding risks and benefits with their doctor. Goal is control of skin condition, not cure.  The use of [place] requires long term medication management, including periodic office visits.    Topical steroids (such as triamcinolone, fluocinolone, fluocinonide, mometasone, clobetasol, halobetasol, betamethasone, hydrocortisone) can cause thinning and lightening of the skin if they are  used for too long in the same area. Your physician has selected the right strength medicine for your problem and area affected on the body. Please use your medication only as directed by your physician to prevent side effects.     Counseling on psoriasis and coordination of care  psoriasis is a chronic non-curable, but treatable genetic/hereditary disease that may have other systemic features affecting other organ systems such as joints (Psoriatic Arthritis). It is associated with an increased risk of inflammatory bowel disease, heart disease, non-alcoholic fatty liver disease, and depression.  Treatments include light and laser treatments; topical medications; and systemic medications including oral and injectables.    Return in about 6 months (around 08/07/2023) for Psoriasis.  ICherlyn Labella, CMA, am acting as scribe for Willeen Niece, MD .   Documentation: I have reviewed the above documentation for accuracy and completeness, and I agree with the above.  Willeen Niece, MD

## 2023-04-08 ENCOUNTER — Other Ambulatory Visit: Payer: Self-pay

## 2023-04-08 DIAGNOSIS — L409 Psoriasis, unspecified: Secondary | ICD-10-CM

## 2023-04-08 MED ORDER — OTEZLA 30 MG PO TABS: 30.0000 mg | ORAL_TABLET | Freq: Two times a day (BID) | ORAL | 2 refills | Status: DC

## 2023-04-08 NOTE — Progress Notes (Signed)
Refill request faxed from [facility]. Escripted

## 2023-05-13 ENCOUNTER — Encounter: Payer: PRIVATE HEALTH INSURANCE | Admitting: Family Medicine

## 2023-05-28 ENCOUNTER — Encounter (INDEPENDENT_AMBULATORY_CARE_PROVIDER_SITE_OTHER): Payer: Self-pay

## 2023-06-16 ENCOUNTER — Ambulatory Visit (INDEPENDENT_AMBULATORY_CARE_PROVIDER_SITE_OTHER): Payer: PRIVATE HEALTH INSURANCE | Admitting: Family Medicine

## 2023-06-16 ENCOUNTER — Encounter: Payer: Self-pay | Admitting: Family Medicine

## 2023-06-16 VITALS — BP 114/68 | HR 70 | Temp 98.0°F | Ht 73.0 in | Wt 207.6 lb

## 2023-06-16 DIAGNOSIS — E785 Hyperlipidemia, unspecified: Secondary | ICD-10-CM | POA: Diagnosis not present

## 2023-06-16 DIAGNOSIS — R7303 Prediabetes: Secondary | ICD-10-CM | POA: Diagnosis not present

## 2023-06-16 DIAGNOSIS — R251 Tremor, unspecified: Secondary | ICD-10-CM | POA: Diagnosis not present

## 2023-06-16 DIAGNOSIS — Z23 Encounter for immunization: Secondary | ICD-10-CM | POA: Diagnosis not present

## 2023-06-16 DIAGNOSIS — Z0000 Encounter for general adult medical examination without abnormal findings: Secondary | ICD-10-CM | POA: Diagnosis not present

## 2023-06-16 DIAGNOSIS — Z125 Encounter for screening for malignant neoplasm of prostate: Secondary | ICD-10-CM | POA: Diagnosis not present

## 2023-06-16 LAB — LIPID PANEL
Cholesterol: 182 mg/dL (ref 0–200)
HDL: 34.5 mg/dL — ABNORMAL LOW (ref >39.00)
LDL Cholesterol: 130 mg/dL — ABNORMAL HIGH (ref 0–99)
NonHDL: 147.94
Total CHOL/HDL Ratio: 5
Triglycerides: 91 mg/dL (ref 0.0–149.0)
VLDL: 18.2 mg/dL (ref 0.0–40.0)

## 2023-06-16 LAB — PSA: PSA: 0.48 ng/mL (ref 0.10–4.00)

## 2023-06-16 LAB — HEMOGLOBIN A1C: Hgb A1c MFr Bld: 6.2 % (ref 4.6–6.5)

## 2023-06-16 NOTE — Progress Notes (Signed)
Henry Alar, MD Phone: [phone]  Henry Garrett is a 57 y.o. male who presents today for CPe.  Diet: Patient notes his diet is not the best though not the worst, does get plenty of vegetables, some fruits, has red meat 2 times a week, not much fried foods, does have sweets most days Exercise: Patient gets 12,000-15,000 steps on a daily basis, he is active though no specific exercise Colonoscopy: 07/15/2022, 10-year recall Prostate cancer screening: Due Family history-  Prostate cancer: no  Colon cancer: no Vaccines-   Flu: due  Tetanus: due  Shingles: due  COVID19: x3 HIV screening: UTD Hep C Screening: UTD Tobacco use: no Alcohol use: no Illicit Drug use: no    Active Ambulatory Problems    Diagnosis Date Noted   Essential hypertension, benign 03/17/2013   OSA (obstructive sleep apnea) 05/28/2013   Routine general medical examination at a health care facility 05/03/2014   Hyperlipidemia 06/28/2016   Fatty liver 09/27/2016   History of alcoholism (HCC) 11/07/2017   Numbness of fingers 06/01/2018   Hypopigmented skin lesion 06/01/2018   Nodule of finger of left hand 06/01/2018   Tremor 06/01/2018   Ganglion cyst of flexor tendon sheath of finger of left hand 06/12/2018   Neuropathy with IgM monoclonal gammopathy (HCC) 01/12/2019   Prediabetes 05/20/2022   Tennis elbow 05/20/2022   Decreased range of motion of right thumb 05/20/2022   Rash 05/20/2022   Screening for colon cancer    Adenomatous polyp of colon    MGUS (monoclonal gammopathy of unknown significance) 11/20/2022   Resolved Ambulatory Problems    Diagnosis Date Noted   Snoring 03/17/2013   Dyshidrotic eczema 03/17/2013   Obesity, unspecified 05/28/2013   Obesity (BMI 30-39.9) 05/03/2014   Temperature intolerance 11/11/2014   Weight gain 11/11/2014   Numbness in left leg 11/11/2014   Dental abscess 02/07/2015   Past Medical History:  Diagnosis Date   Sleep apnea    Squamous cell  carcinoma of skin of face 02/2019    Family History  Problem Relation Age of Onset   Heart disease Brother        heart valve replacement, [place]   Arthritis Mother        s/p bilateral knee replacement   Lung cancer Father        suspcted lung cancer/ Hx of smoking    Social History   Socioeconomic History   Marital status: Married    Spouse name: Not on file   Number of children: Not on file   Years of education: Not on file   Highest education level: Not on file  Occupational History   Not on file  Tobacco Use   Smoking status: Former    Current packs/day: 0.00    Average packs/day: 1 pack/day for 15.0 years (15.0 ttl pk-yrs)    Types: Cigarettes    Start date: 03/17/1985    Quit date: 03/17/2000    Years since quitting: 23.2   Smokeless tobacco: Never  Substance and Sexual Activity   Alcohol use: Yes    Alcohol/week: 20.0 standard drinks of alcohol    Types: 20 Cans of beer per week   Drug use: No   Sexual activity: Not on file  Other Topics Concern   Not on file  Social History Narrative   Lives in [place]. From [place]. Married, 3 children 19,15,11.  2 dogs      Work - Museum/gallery curator for [employer]  smoking 15 years ago; hx of alcohol abuse; quit 16 months.    Social Determinants of Health   Financial Resource Strain: Not on file  Food Insecurity: Not on file  Transportation Needs: Not on file  Physical Activity: Not on file  Stress: Not on file  Social Connections: Not on file  Intimate Partner Violence: Not on file    ROS  General:  Negative for nexplained weight loss, fever Skin: Negative for new or changing mole, sore that won't heal HEENT: Negative for trouble hearing, trouble seeing, ringing in ears, mouth sores, hoarseness, change in voice, dysphagia. CV:  Negative for chest pain, dyspnea, edema, palpitations Resp: Negative for cough, dyspnea, hemoptysis GI: Negative for nausea, vomiting, diarrhea, constipation, abdominal pain, melena,  hematochezia. GU: Negative for dysuria, incontinence, urinary hesitance, hematuria, vaginal or penile discharge, polyuria, sexual difficulty, lumps in testicle or breasts MSK: Negative for muscle cramps or aches, joint pain or swelling Neuro: Negative for headaches, weakness, numbness, dizziness, passing out/fainting Psych: Negative for depression, anxiety, memory problems  Objective  Physical Exam Vitals:   06/16/23 0829  BP: 114/68  Pulse: 70  Temp: 98 F (36.7 C)  SpO2: 99%    BP Readings from Last 3 Encounters:  06/16/23 114/68  02/05/23 133/75  01/01/23 (!) 144/85   Wt Readings from Last 3 Encounters:  06/16/23 207 lb 9.6 oz (94.2 kg)  11/20/22 223 lb 3.2 oz (101.2 kg)  09/26/22 228 lb 12.8 oz (103.8 kg)    Physical Exam Constitutional:      General: He is not in acute distress.    Appearance: He is not diaphoretic.  HENT:     Head: Normocephalic and atraumatic.  Cardiovascular:     Rate and Rhythm: Normal rate and regular rhythm.     Heart sounds: Normal heart sounds.  Pulmonary:     Effort: Pulmonary effort is normal.     Breath sounds: Normal breath sounds.  Abdominal:     General: Bowel sounds are normal. There is no distension.     Palpations: Abdomen is soft.     Tenderness: There is no abdominal tenderness.  Musculoskeletal:     Right lower leg: No edema.     Left lower leg: No edema.  Lymphadenopathy:     Cervical: No cervical adenopathy.  Skin:    General: Skin is warm and dry.  Neurological:     Mental Status: He is alert.      Assessment/Plan:   Routine general medical examination at a health care facility Assessment & Plan: Physical exam completed.  Encouraged healthy diet and exercise.  Discussed reducing sweet intake.  Discussed adding in some exercise on top of what he gets on a day-to-day basis.  PSA completed today for prostate cancer screening tetanus vaccine given today.  I encouraged the patient to get the flu vaccine, time  between September and November this year.  I encouraged patient to get the Shingrix vaccine.  Lab work as outlined.   Tremor Assessment & Plan: Chronic issue.  Patient with essential tremor.  He notes neurology placed him on propranolol 60 mg daily to help with this though he has been having heart rates in the low 50s and this morning had a heart rate of 47.  I encouraged him to contact his neurologist given that the 60 mg dose is the starting dose.  They may need to consider an alternative treatment.   Hyperlipidemia, unspecified hyperlipidemia type -     Lipid panel  Prediabetes -     Hemoglobin A1c  Prostate cancer screening -     PSA  Need for vaccine for Td (tetanus-diphtheria) -     Td vaccine greater than or equal to 7yo preservative free IM    Return in about 1 year (around 06/15/2024) for physical.   Henry Alar, MD [facility]

## 2023-06-16 NOTE — Assessment & Plan Note (Signed)
Physical exam completed.  Encouraged healthy diet and exercise.  Discussed reducing sweet intake.  Discussed adding in some exercise on top of what he gets on a day-to-day basis.  PSA completed today for prostate cancer screening tetanus vaccine given today.  I encouraged the patient to get the flu vaccine, time between September and November this year.  I encouraged patient to get the Shingrix vaccine.  Lab work as outlined.

## 2023-06-16 NOTE — Assessment & Plan Note (Signed)
Chronic issue.  Patient with essential tremor.  He notes neurology placed him on propranolol 60 mg daily to help with this though he has been having heart rates in the low 50s and this morning had a heart rate of 47.  I encouraged him to contact his neurologist given that the 60 mg dose is the starting dose.  They may need to consider an alternative treatment.

## 2023-06-16 NOTE — Patient Instructions (Signed)
Please call your neurologist to see about alternative options to the propranolol.

## 2023-06-18 ENCOUNTER — Telehealth: Payer: Self-pay

## 2023-06-18 NOTE — Telephone Encounter (Signed)
-----   Message from [name] sent at 06/17/2023 12:04 PM EDT ----- Please let the patient know that his cholesterol is stable.  He is in an intermediate risk category where he potentially could benefit from starting on cholesterol medication to reduce his risk of stroke and heart attack.  If he would like to start on a statin I can send that to his pharmacy otherwise he needs to work on diet and exercise.  His A1c is in the prediabetic range and is slightly worse than 6 months ago.  He needs to work on diet and exercise for that as well.  The 10-year ASCVD risk score (Arnett DK, et al., 2019) is: 5.7%   Values used to calculate the score:     Age: 57 years     Sex: Male     Is Non-Hispanic African American: No     Diabetic: No     Tobacco smoker: No     Systolic Blood Pressure: 114 mmHg     Is BP treated: No     HDL Cholesterol: 34.5 mg/dL     Total Cholesterol: 182 mg/dL

## 2023-06-18 NOTE — Telephone Encounter (Signed)
 Left message to call the office back regarding the lab results below.

## 2023-07-14 ENCOUNTER — Other Ambulatory Visit: Payer: Self-pay

## 2023-07-14 ENCOUNTER — Telehealth: Payer: Self-pay

## 2023-07-14 DIAGNOSIS — L409 Psoriasis, unspecified: Secondary | ICD-10-CM

## 2023-07-14 MED ORDER — OTEZLA 30 MG PO TABS
30.0000 mg | ORAL_TABLET | Freq: Two times a day (BID) | ORAL | 2 refills | Status: DC
Start: 2023-07-14 — End: 2023-10-20

## 2023-07-14 NOTE — Telephone Encounter (Signed)
Patient called and stated that his insurance paid for [facility] for the first few months, but this month was going to be over $600. He called [facility], but they had no explanation. Prescription resent to [facility].

## 2023-08-05 ENCOUNTER — Ambulatory Visit: Payer: PRIVATE HEALTH INSURANCE | Admitting: Dermatology

## 2023-08-05 DIAGNOSIS — L409 Psoriasis, unspecified: Secondary | ICD-10-CM

## 2023-08-05 DIAGNOSIS — Z7189 Other specified counseling: Secondary | ICD-10-CM

## 2023-08-05 DIAGNOSIS — Z79899 Other long term (current) drug therapy: Secondary | ICD-10-CM

## 2023-08-05 NOTE — Progress Notes (Signed)
   Follow-Up Visit   Subjective  Henry Garrett is a 57 y.o. male who presents for the following: Psoriasis  Started Henderson [place] in May 2024- 6 month follow up. Patient has been having issue with Henderson [place] and has not had any for 5-6 days. Insurance has it straightened out and patient should be receiving it tomorrow. Patient has no side effects, psoriasis at hands controlled. Also using Zoryve cream as needed.   The following portions of the chart were reviewed this encounter and updated as appropriate: medications, allergies, medical history  Review of Systems:  No other skin or systemic complaints except as noted in HPI or Assessment and Plan.  Objective  Well appearing patient in no apparent distress; mood and affect are within normal limits.  Areas Examined: hands  Relevant exam findings are noted in the Assessment and Plan.      Assessment & Plan     PSORIASIS Mild erythema with xerosis at B/L palms, few thin pink papules on hand dorsa 1% BSA.  Chronic condition with duration or expected duration over one year. Currently well-controlled.   Patient denies joint pain  Treatment Plan: Continue Otezla 30 mg PO bid Continue Zoryve cream daily PRN  Counseling on psoriasis and coordination of care  psoriasis is a chronic non-curable, but treatable genetic/hereditary disease that may have other systemic features affecting other organ systems such as joints (Psoriatic Arthritis). It is associated with an increased risk of inflammatory bowel disease, heart disease, non-alcoholic fatty liver disease, and depression.  Treatments include light and laser treatments; topical medications; and systemic medications including oral and injectables.  Side effects of Otezla (apremilast) include diarrhea, nausea, headache, upper respiratory infection, depression, and weight decrease (5-10%). It should only be taken by pregnant women after a discussion regarding risks and benefits with their  doctor. Goal is control of skin condition, not cure.  The use of Henderson [place] requires long term medication management, including periodic office visits.  Long term medication management.  Patient is using long term (months to years) prescription medication  to control their dermatologic condition.  These medications require periodic monitoring to evaluate for efficacy and side effects and may require periodic laboratory monitoring.    Return in about 6 months (around 02/03/2024) for with Dr. Roseanne Reno, Psoriasis, Hx SCC.  Anise Salvo, RMA, am acting as scribe for Willeen Niece, MD .   Documentation: I have reviewed the above documentation for accuracy and completeness, and I agree with the above.  Willeen Niece, MD

## 2023-08-05 NOTE — Patient Instructions (Signed)
Side effects of Otezla (apremilast) include diarrhea, nausea, headache, upper respiratory infection, depression, and weight decrease (5-10%). It should only be taken by pregnant women after a discussion regarding risks and benefits with their doctor. Goal is control of skin condition, not cure.  The use of [place] requires long term medication management, including periodic office visits.  Due to recent changes in healthcare laws, you Josephs see results of your pathology and/or laboratory studies on MyChart before the doctors have had a chance to review them. We understand that in some cases there Meras be results that are confusing or concerning to you. Please understand that not all results are received at the same time and often the doctors Bacot need to interpret multiple results in order to provide you with the best plan of care or course of treatment. Therefore, we ask that you please give [place] 2 business days to thoroughly review all your results before contacting the office for clarification. Should we see a critical lab result, you will be contacted sooner.   If You Need Anything After Your Visit  If you have any questions or concerns for your doctor, please call our main line at [phone] and press option 4 to reach your doctor's medical assistant. If no one answers, please leave a voicemail as directed and we will return your call as soon as possible. Messages left after 4 pm will be answered the following business day.   You Mourer also send [place] a message via MyChart. We typically respond to MyChart messages within 1-2 business days.  For prescription refills, please ask your pharmacy to contact our office. Our fax number is [phone].  If you have an urgent issue when the clinic is closed that cannot wait until the next business day, you can page your doctor at the number below.    Please note that while we do our best to be available for urgent issues outside of office hours, we are not  available 24/7.   If you have an urgent issue and are unable to reach [place], you Bark choose to seek medical care at your doctor's office, retail clinic, urgent care center, or emergency room.  If you have a medical emergency, please immediately call 911 or go to the emergency department.  Pager Numbers  - Dr. Gwen Pounds: [phone]  - Dr. Roseanne Reno: [phone]  - Dr. Katrinka Blazing: [phone]   In the event of inclement weather, please call our main line at [phone] for an update on the status of any delays or closures.  Dermatology Medication Tips: Please keep the boxes that topical medications come in in order to help keep track of the instructions about where and how to use these. Pharmacies typically print the medication instructions only on the boxes and not directly on the medication tubes.   If your medication is too expensive, please contact our office at [phone] option 4 or send [place] a message through MyChart.   We are unable to tell what your co-pay for medications will be in advance as this is different depending on your insurance coverage. However, we Humphrey be able to find a substitute medication at lower cost or fill out paperwork to get insurance to cover a needed medication.   If a prior authorization is required to get your medication covered by your insurance company, please allow [place] 1-2 business days to complete this process.  Drug prices often vary depending on where the prescription is filled and some pharmacies [place] offer cheaper prices.  The  website www.goodrx.com contains coupons for medications through different pharmacies. The prices here do not account for what the cost Nelms be with help from insurance (it Dwight be cheaper with your insurance), but the website can give you the price if you did not use any insurance.  - You can print the associated coupon and take it with your prescription to the pharmacy.  - You Lacross also stop by our office during regular business hours  and pick up a GoodRx coupon card.  - If you need your prescription sent electronically to a different pharmacy, notify our office through [facility] or by phone at [phone] option 4.     Si Usted Necesita Algo Despus de Su Visita  Tambin puede enviarnos un mensaje a travs de Clinical cytogeneticist. Por lo general respondemos a los mensajes de MyChart en el transcurso de 1 a 2 das hbiles.  Para renovar recetas, por favor pida a su farmacia que se ponga en contacto con nuestra oficina. Annie Sable de fax es [place] [phone].  Si tiene un asunto urgente cuando la clnica est cerrada y que no puede esperar hasta el siguiente da hbil, puede llamar/localizar a su doctor(a) al nmero que aparece a continuacin.   Por favor, tenga en cuenta que aunque hacemos todo lo posible para estar disponibles para asuntos urgentes fuera del horario de [place], no estamos disponibles las 24 horas del da, los 7 [address]  [address] de la [place].   Si tiene un problema urgente y no puede comunicarse con nosotros, puede optar por buscar atencin mdica  en el consultorio de su doctor(a), en una clnica privada, en un centro de atencin urgente o en una sala de emergencias.  Si tiene Engineer, drilling, por favor llame inmediatamente al 911 o vaya a la sala de emergencias.  Nmeros de bper  - Dr. Gwen Pounds: [phone]  - Dra. Roseanne Reno: [phone]  - Dr. Katrinka Blazing: [phone]   En caso de inclemencias del tiempo, por favor llame a Lacy Duverney principal al [phone] para una actualizacin sobre el [place] de cualquier retraso o cierre.  Consejos para la medicacin en dermatologa: Por favor, guarde las cajas en las que vienen los medicamentos de uso tpico para ayudarle a seguir las instrucciones sobre dnde y cmo usarlos. Las farmacias generalmente imprimen las instrucciones del medicamento slo en las cajas y no directamente en los tubos del [place].   Si su medicamento es muy caro, por favor, pngase  en contacto con Rolm Gala llamando al [phone] y presione la opcin 4 o envenos un mensaje a travs de Clinical cytogeneticist.   No podemos decirle cul ser su copago por los medicamentos por adelantado ya que esto es diferente dependiendo de la cobertura de su seguro. Sin embargo, es posible que podamos encontrar un medicamento sustituto a Audiological scientist un formulario para que el seguro cubra el medicamento que se considera necesario.   Si se requiere una autorizacin previa para que su compaa de seguros [place] su medicamento, por favor permtanos de 1 a 2 das hbiles para completar [address]  Los precios de los medicamentos varan con frecuencia dependiendo del Environmental consultant de dnde se surte la receta y alguna farmacias pueden ofrecer precios ms baratos.  El sitio web www.goodrx.com tiene cupones para medicamentos de Health and safety inspector. Los precios aqu no tienen en cuenta lo que podra costar con la ayuda del seguro (puede ser ms barato con su seguro), pero el sitio web puede darle el precio si no utiliz Tourist information centre manager.  -  Puede imprimir el cupn correspondiente y llevarlo con su receta a la farmacia.  - Tambin puede pasar por nuestra oficina durante el horario de atencin regular y Education officer, museum una tarjeta de cupones de GoodRx.  - Si necesita que su receta se enve electrnicamente a una farmacia diferente, informe a nuestra oficina a travs de MyChart de  o por telfono llamando al [phone] y presione la opcin 4.

## 2023-08-21 ENCOUNTER — Ambulatory Visit: Payer: PRIVATE HEALTH INSURANCE | Admitting: Adult Health

## 2023-08-21 ENCOUNTER — Encounter: Payer: Self-pay | Admitting: Adult Health

## 2023-08-21 VITALS — BP 120/76 | HR 68 | Temp 97.7°F | Ht 73.0 in | Wt 215.2 lb

## 2023-08-21 DIAGNOSIS — G4733 Obstructive sleep apnea (adult) (pediatric): Secondary | ICD-10-CM | POA: Diagnosis not present

## 2023-08-21 NOTE — Patient Instructions (Signed)
Continue on CPAP At bedtime   Keep up good work .  Do not drive if sleepy  Work on healthy weight  Follow up in 1 year and As needed

## 2023-08-21 NOTE — Assessment & Plan Note (Signed)
Excellent control compliance on nocturnal CPAP.  Continue current setting  Plan  Patient Instructions  Continue on CPAP At bedtime   Keep up good work .  Do not drive if sleepy  Work on healthy weight  Follow up in 1 year and As needed

## 2023-08-21 NOTE — Progress Notes (Signed)
@Patient  ID: Henry Garrett, male    DOB: August 15, 1966, 57 y.o.   MRN: [id number]  Chief Complaint  Patient presents with   Follow-up    Referring provider: Glori Luis, MD  HPI: 57 year old male seen for sleep consult September 26, 2022 to establish for sleep apnea   TEST/EVENTS :  Home sleep study June 2014 showed severe sleep apnea with AHI at 32.3/hour.   08/21/2023 Follow up : OSA  Patient presents for 1 year follow-up.  Patient has severe obstructive sleep apnea.  He is on nocturnal CPAP.  Patient says he wears his CPAP every single night cannot sleep without it.  Feels that he benefits from CPAP with decreased daytime sleepiness.  CPAP download shows excellent compliance with 100% usage.  Daily average usage at 7 hours.  Patient is on auto CPAP 10 to 20 cm H2O.  Daily average pressure at 15.4cmH2O. AHI 2.6/hour.  Using Dreamwear full face mask.   No Known Allergies  Immunization History  Administered Date(s) Administered   Influenza,inj,Quad PF,6+ Mos 11/11/2014, 07/07/2015, 06/28/2016, 10/02/2018   Td 06/16/2023   Tdap 03/17/2010    Past Medical History:  Diagnosis Date   Sleep apnea    CPAP 12-20cmH2O   Squamous cell carcinoma of skin of face 02/2019   left cheek. Tx with topical 5FU/Calcipotriene    Tobacco History: Social History   Tobacco Use  Smoking Status Former   Current packs/day: 0.00   Average packs/day: 1 pack/day for 15.0 years (15.0 ttl pk-yrs)   Types: Cigarettes   Start date: 03/17/1985   Quit date: 03/17/2000   Years since quitting: 23.4  Smokeless Tobacco Never   Counseling given: Not Answered   Outpatient Medications Prior to Visit  Medication Sig Dispense Refill   Apremilast (OTEZLA) 30 MG TABS Take 1 tablet (30 mg total) by mouth 2 (two) times daily. 60 tablet 2   propranolol ER (INDERAL LA) 60 MG 24 hr capsule Take 1 tablet (60mg ) nightly. See after visit summary from 05/28/2023 for instructions on starting medication      Roflumilast (ZORYVE) 0.3 % CREA Apply in morning to hands 60 g 2   No facility-administered medications prior to visit.     Review of Systems:   Constitutional:   No  weight loss, night sweats,  Fevers, chills, fatigue, or  lassitude.  HEENT:   No headaches,  Difficulty swallowing,  Tooth/dental problems, or  Sore throat,                No sneezing, itching, ear ache, nasal congestion, post nasal drip,   CV:  No chest pain,  Orthopnea, PND, swelling in lower extremities, anasarca, dizziness, palpitations, syncope.   GI  No heartburn, indigestion, abdominal pain, nausea, vomiting, diarrhea, change in bowel habits, loss of appetite, bloody stools.   Resp: No shortness of breath with exertion or at rest.  No excess mucus, no productive cough,  No non-productive cough,  No coughing up of blood.  No change in color of mucus.  No wheezing.  No chest wall deformity  Skin: no rash or lesions.  GU: no dysuria, change in color of urine, no urgency or frequency.  No flank pain, no hematuria   MS:  No joint pain or swelling.  No decreased range of motion.  No back pain.    Physical Exam  BP 120/76 (BP Location: Right Arm, Cuff Size: Normal)   Pulse 68   Temp 97.7 F (36.5 C)   Ht  6\' 1"  (1.854 m)   Wt 215 lb 3.2 oz (97.6 kg)   SpO2 100%   BMI 28.39 kg/m   GEN: A/Ox3; pleasant , NAD, well nourished    HEENT:  [place]/AT,  EACs-clear, TMs-wnl, NOSE-clear, THROAT-clear, no lesions, no postnasal drip or exudate noted.  Class 3 MP airway   NECK:  Supple w/ fair ROM; no JVD; normal carotid impulses w/o bruits; no thyromegaly or nodules palpated; no lymphadenopathy.    RESP  Clear  P & A; w/o, wheezes/ rales/ or rhonchi. no accessory muscle use, no dullness to percussion  CARD:  RRR, no m/r/g, no peripheral edema, pulses intact, no cyanosis or clubbing.  GI:   Soft & nt; nml bowel sounds; no organomegaly or masses detected.   Musco: Warm bil, no deformities or joint swelling noted.    Neuro: alert, no focal deficits noted.    Skin: Warm, no lesions or rashes    Lab Results:  CBC    Component Value Date/Time   WBC 7.2 05/20/2022 0908   RBC 4.68 05/20/2022 0908   HGB 13.8 05/20/2022 0908   HCT 40.8 05/20/2022 0908   PLT 268.0 05/20/2022 0908   MCV 87.4 05/20/2022 0908   MCH 28.8 04/16/2019 1452   MCHC 33.8 05/20/2022 0908   RDW 13.2 05/20/2022 0908   LYMPHSABS 1.7 05/20/2022 0908   MONOABS 0.8 05/20/2022 0908   EOSABS 0.1 05/20/2022 0908   BASOSABS 0.1 05/20/2022 0908    BMET    Component Value Date/Time   NA 137 05/20/2022 0908   K 3.9 05/20/2022 0908   CL 102 05/20/2022 0908   CO2 27 05/20/2022 0908   GLUCOSE 91 05/20/2022 0908   BUN 13 05/20/2022 0908   CREATININE 0.86 05/20/2022 0908   CALCIUM 9.2 05/20/2022 0908   GFRNONAA >60 04/16/2019 1452   GFRAA >60 04/16/2019 1452    BNP No results found for: "BNP"  ProBNP No results found for: "PROBNP"  Imaging: No results found.  Administration History     None           No data to display          No results found for: "NITRICOXIDE"      Assessment & Plan:   OSA (obstructive sleep apnea) Excellent control compliance on nocturnal CPAP.  Continue current setting  Plan  Patient Instructions  Continue on CPAP At bedtime   Keep up good work .  Do not drive if sleepy  Work on healthy weight  Follow up in 1 year and As needed        Rubye Oaks, NP 08/21/2023

## 2023-10-20 ENCOUNTER — Other Ambulatory Visit: Payer: Self-pay

## 2023-10-20 DIAGNOSIS — L409 Psoriasis, unspecified: Secondary | ICD-10-CM

## 2023-10-20 MED ORDER — OTEZLA 30 MG PO TABS
30.0000 mg | ORAL_TABLET | Freq: Two times a day (BID) | ORAL | 5 refills | Status: DC
Start: 2023-10-20 — End: 2024-06-18

## 2023-11-20 ENCOUNTER — Ambulatory Visit: Payer: PRIVATE HEALTH INSURANCE | Admitting: Dermatology

## 2024-02-10 ENCOUNTER — Ambulatory Visit: Payer: PRIVATE HEALTH INSURANCE | Admitting: Dermatology

## 2024-02-10 ENCOUNTER — Encounter: Payer: Self-pay | Admitting: Dermatology

## 2024-02-10 DIAGNOSIS — Z79899 Other long term (current) drug therapy: Secondary | ICD-10-CM | POA: Diagnosis not present

## 2024-02-10 DIAGNOSIS — L409 Psoriasis, unspecified: Secondary | ICD-10-CM

## 2024-02-10 MED ORDER — ZORYVE 0.3 % EX CREA: TOPICAL_CREAM | CUTANEOUS | 3 refills | Status: AC

## 2024-02-10 NOTE — Progress Notes (Signed)
   Follow-Up Visit   Subjective  Henry Garrett is a 58 y.o. male who presents for the following: Psoriasis hands, 53m f/u, d/c Otezla 12/24 due to unsure of price, Zoryve cr 2-3x/wk   The following portions of the chart were reviewed this encounter and updated as appropriate: medications, allergies, medical history  Review of Systems:  No other skin or systemic complaints except as noted in HPI or Assessment and Plan.  Objective  Well appearing patient in no apparent distress; mood and affect are within normal limits.   A focused examination was performed of the following areas: hands  Relevant exam findings are noted in the Assessment and Plan.    Assessment & Plan   PSORIASIS hands Exam: mild hyperkeratosis palms, R>L, similar first webspace bilateral 1% BSA.  Chronic and persistent condition with duration or expected duration over one year. Condition is symptomatic/ bothersome to patient. Not currently at goal.   Increasing joint pain since been off Otezla.  Psoriasis is a chronic non-curable, but treatable genetic/hereditary disease that may have other systemic features affecting other organ systems such as joints (Psoriatic Arthritis). It is associated with an increased risk of inflammatory bowel disease, heart disease, non-alcoholic fatty liver disease, and depression.  Treatments include light and laser treatments; topical medications; and systemic medications including oral and injectables.  Treatment Plan: Cont Zoryve 0.3% cr qd prn flares If worsens may restart Otezla 30mg  1 po bid, pt prefers to not take it at this time due to potential cost. ([facility] will not tell him exactly how much his copay will be- gives him range of 0-$2000)  Side effects of Otezla (apremilast) include diarrhea, nausea, headache, upper respiratory infection, depression, and weight decrease (5-10%). It should only be taken by pregnant women after a discussion regarding  risks and benefits with their doctor. Goal is control of skin condition, not cure.  The use of Otezla requires long term medication management, including periodic office visits.  PSORIASIS   Related Medications Apremilast (OTEZLA) 30 MG TABS Take 1 tablet (30 mg total) by mouth 2 (two) times daily. Roflumilast (ZORYVE) 0.3 % CREA qd to hands prn flares  Return in about 1 year (around 02/09/2025) for Psoriasis f/u.  I, Rollie Clipper, RMA, am acting as scribe for Artemio Larry, MD .   Documentation: I have reviewed the above documentation for accuracy and completeness, and I agree with the above.  Artemio Larry, MD

## 2024-02-10 NOTE — Patient Instructions (Addendum)
 Your prescription was sent to [facility] in [place]. A representative from [facility] will contact you within 3 business hours to verify your address and insurance information to schedule a free delivery. If for any reason you do not receive a phone call from them, please reach out to them. Their phone number is [phone] and their hours are Monday-Friday 9:00 am-5:00 pm.     Due to recent changes in healthcare laws, you may see results of your pathology and/or laboratory studies on MyChart before the doctors have had a chance to review them. We understand that in some cases there may be results that are confusing or concerning to you. Please understand that not all results are received at the same time and often the doctors may need to interpret multiple results in order to provide you with the best plan of care or course of treatment. Therefore, we ask that you please give [place] 2 business days to thoroughly review all your results before contacting the office for clarification. Should we see a critical lab result, you will be contacted sooner.   If You Need Anything After Your Visit  If you have any questions or concerns for your doctor, please call our main line at [phone] and press option 4 to reach your doctor's medical assistant. If no one answers, please leave a voicemail as directed and we will return your call as soon as possible. Messages left after 4 pm will be answered the following business day.   You may also send [place] a message via MyChart. We typically respond to MyChart messages within 1-2 business days.  For prescription refills, please ask your pharmacy to contact our office. Our fax number is [phone].  If you have an urgent issue when the clinic is closed that cannot wait until the next business day, you can page your doctor at the number below.    Please note that while we do our best to be available for urgent issues outside of office hours, we are not  available 24/7.   If you have an urgent issue and are unable to reach [place], you may choose to seek medical care at your doctor's office, retail clinic, urgent care center, or emergency room.  If you have a medical emergency, please immediately call 911 or go to the emergency department.  Pager Numbers  - Dr. Gwen Pounds: [phone]  - Dr. Roseanne Reno: [phone]  - Dr. Katrinka Blazing: [phone]   In the event of inclement weather, please call our main line at [phone] for an update on the status of any delays or closures.  Dermatology Medication Tips: Please keep the boxes that topical medications come in in order to help keep track of the instructions about where and how to use these. Pharmacies typically print the medication instructions only on the boxes and not directly on the medication tubes.   If your medication is too expensive, please contact our office at [phone] option 4 or send [place] a message through MyChart.   We are unable to tell what your co-pay for medications will be in advance as this is different depending on your insurance coverage. However, we may be able to find a substitute medication at lower cost or fill out paperwork to get insurance to cover a needed medication.   If a prior authorization is required to get your medication covered by your insurance company, please allow [place] 1-2 business days to complete this process.  Drug prices often vary depending on where the prescription is  filled and some pharmacies may offer cheaper prices.  The website www.goodrx.com contains coupons for medications through different pharmacies. The prices here do not account for what the cost may be with help from insurance (it may be cheaper with your insurance), but the website can give you the price if you did not use any insurance.  - You can print the associated coupon and take it with your prescription to the pharmacy.  - You may also stop by our office during regular business hours  and pick up a GoodRx coupon card.  - If you need your prescription sent electronically to a different pharmacy, notify our office through [facility] or by phone at [phone] option 4.     Si Usted Necesita Algo Despus de Su Visita  Tambin puede enviarnos un mensaje a travs de Clinical cytogeneticist. Por lo general respondemos a los mensajes de MyChart en el transcurso de 1 a 2 das hbiles.  Para renovar recetas, por favor pida a su farmacia que se ponga en contacto con nuestra oficina. Annie Sable de fax es [place] [phone].  Si tiene un asunto urgente cuando la clnica est cerrada y que no puede esperar hasta el siguiente da hbil, puede llamar/localizar a su doctor(a) al nmero que aparece a continuacin.   Por favor, tenga en cuenta que aunque hacemos todo lo posible para estar disponibles para asuntos urgentes fuera del horario de [place], no estamos disponibles las 24 horas del da, los 7 [address]  [address] de la [place].   Si tiene un problema urgente y no puede comunicarse con nosotros, puede optar por buscar atencin mdica  en el consultorio de su doctor(a), en una clnica privada, en un centro de atencin urgente o en una sala de emergencias.  Si tiene Engineer, drilling, por favor llame inmediatamente al 911 o vaya a la sala de emergencias.  Nmeros de bper  - Dr. Gwen Pounds: [phone]  - Dra. Roseanne Reno: [phone]  - Dr. Katrinka Blazing: [phone]   En caso de inclemencias del tiempo, por favor llame a Lacy Duverney principal al [phone] para una actualizacin sobre el [place] de cualquier retraso o cierre.  Consejos para la medicacin en dermatologa: Por favor, guarde las cajas en las que vienen los medicamentos de uso tpico para ayudarle a seguir las instrucciones sobre dnde y cmo usarlos. Las farmacias generalmente imprimen las instrucciones del medicamento slo en las cajas y no directamente en los tubos del [place].   Si su medicamento es muy caro, por favor, pngase  en contacto con Rolm Gala llamando al [phone] y presione la opcin 4 o envenos un mensaje a travs de Clinical cytogeneticist.   No podemos decirle cul ser su copago por los medicamentos por adelantado ya que esto es diferente dependiendo de la cobertura de su seguro. Sin embargo, es posible que podamos encontrar un medicamento sustituto a Audiological scientist un formulario para que el seguro cubra el medicamento que se considera necesario.   Si se requiere una autorizacin previa para que su compaa de seguros [place] su medicamento, por favor permtanos de 1 a 2 das hbiles para completar [address]  Los precios de los medicamentos varan con frecuencia dependiendo del Environmental consultant de dnde se surte la receta y alguna farmacias pueden ofrecer precios ms baratos.  El sitio web www.goodrx.com tiene cupones para medicamentos de Health and safety inspector. Los precios aqu no tienen en cuenta lo que podra costar con la ayuda del seguro (puede ser ms barato con su seguro), pero el sitio web puede  darle el precio si no utiliz [employer].  - Puede imprimir el cupn correspondiente y llevarlo con su receta a la farmacia.  - Tambin puede pasar por nuestra oficina durante el horario de atencin regular y Education officer, museum una tarjeta de cupones de GoodRx.  - Si necesita que su receta se enve electrnicamente a una farmacia diferente, informe a nuestra oficina a travs de MyChart de [place] o por telfono llamando al [phone] y presione la opcin 4.

## 2024-06-18 ENCOUNTER — Encounter: Payer: PRIVATE HEALTH INSURANCE | Admitting: Family Medicine

## 2024-06-18 ENCOUNTER — Ambulatory Visit: Payer: PRIVATE HEALTH INSURANCE | Admitting: Nurse Practitioner

## 2024-06-18 ENCOUNTER — Encounter: Payer: Self-pay | Admitting: Nurse Practitioner

## 2024-06-18 VITALS — BP 138/72 | HR 93 | Temp 98.1°F | Ht 73.0 in | Wt 212.2 lb

## 2024-06-18 DIAGNOSIS — G4733 Obstructive sleep apnea (adult) (pediatric): Secondary | ICD-10-CM | POA: Diagnosis not present

## 2024-06-18 DIAGNOSIS — Z0001 Encounter for general adult medical examination with abnormal findings: Secondary | ICD-10-CM | POA: Diagnosis not present

## 2024-06-18 DIAGNOSIS — D472 Monoclonal gammopathy: Secondary | ICD-10-CM

## 2024-06-18 DIAGNOSIS — R220 Localized swelling, mass and lump, head: Secondary | ICD-10-CM

## 2024-06-18 DIAGNOSIS — L409 Psoriasis, unspecified: Secondary | ICD-10-CM | POA: Diagnosis not present

## 2024-06-18 DIAGNOSIS — E785 Hyperlipidemia, unspecified: Secondary | ICD-10-CM

## 2024-06-18 DIAGNOSIS — R7303 Prediabetes: Secondary | ICD-10-CM

## 2024-06-18 DIAGNOSIS — Z125 Encounter for screening for malignant neoplasm of prostate: Secondary | ICD-10-CM

## 2024-06-18 DIAGNOSIS — G63 Polyneuropathy in diseases classified elsewhere: Secondary | ICD-10-CM

## 2024-06-18 NOTE — Progress Notes (Signed)
 Leron Glance, NP-C Phone: [phone]  Henry Garrett is a 58 y.o. male who presents today for transfer of care.   Discussed the use of AI scribe software for clinical note transcription with the patient, who gave verbal consent to proceed.  History of Present Illness   Henry Garrett is a 58 year old male who presents for a transfer of care and annual physical exam.  He has noticed a growth on his face that has been slowly enlarging and is now noticeable in photographs when he smiles. A similar growth is present on the back of his neck. He is uncertain if specialist evaluation is necessary and has not seen dermatology recently.  He is concerned about losing muscle mass in his legs, a change also observed by his wife. His son was diagnosed with low testosterone , prompting him to consider testing for himself to determine if it might be contributing to his muscle loss.  His past medical history includes psoriasis, for which he sees dermatology, and sleep apnea, managed with a CPAP machine for twelve years, which he finds beneficial. He has a history of prediabetes and high cholesterol, both of which improved after he stopped drinking alcohol six years ago, leading to normalized cholesterol, blood sugar levels, and blood pressure. He is under surveillance for a condition between MGUS, with regular oncology follow-ups. An initial bone marrow biopsy was suboptimal.  Socially, he does not consume alcohol and has abstained for over six years. He occasionally drinks soda, usually Sprite Zero, and consumes ice cream four to five times a week. He is active at work, averaging 2,000 steps a day, but does not engage in formal exercise. He cooks at home and takes leftovers to work, describing his diet as well-balanced but admits to having a sweet tooth.  No chest pain, shortness of breath, abdominal pain, constipation, burning during urination, headaches, dizziness, trouble swallowing,  skin changes, swelling in the legs, joint pain, mood problems, anxiety, or depression. He reports sleeping about six hours a night during the week and does not feel tired during the day.      Social History   Tobacco Use  Smoking Status Former   Current packs/day: 0.00   Average packs/day: 1 pack/day for 15.0 years (15.0 ttl pk-yrs)   Types: Cigarettes   Start date: 03/17/1985   Quit date: 03/17/2000   Years since quitting: 24.3  Smokeless Tobacco Never    Current Outpatient Medications on File Prior to Visit  Medication Sig Dispense Refill   primidone (MYSOLINE) 50 MG tablet Take 1 tablet by mouth 2 (two) times daily.     Roflumilast  (ZORYVE ) 0.3 % CREA qd to hands prn flares 60 g 3   No current facility-administered medications on file prior to visit.     ROS see history of present illness  Objective  Physical Exam Vitals:   06/18/24 1516  BP: 138/72  Pulse: 93  Temp: 98.1 F (36.7 C)  SpO2: 93%    BP Readings from Last 3 Encounters:  06/18/24 138/72  08/21/23 120/76  06/16/23 114/68   Wt Readings from Last 3 Encounters:  06/18/24 212 lb 3.2 oz (96.3 kg)  08/21/23 215 lb 3.2 oz (97.6 kg)  06/16/23 207 lb 9.6 oz (94.2 kg)    Physical Exam Constitutional:      General: He is not in acute distress.    Appearance: Normal appearance.  HENT:     Head: Normocephalic.      Comments: Hard, cyst  like mass noted    Right Ear: Tympanic membrane normal.     Left Ear: Tympanic membrane normal.     Nose: Nose normal.     Mouth/Throat:     Mouth: Mucous membranes are moist.     Pharynx: Oropharynx is clear.  Eyes:     Conjunctiva/sclera: Conjunctivae normal.     Pupils: Pupils are equal, round, and reactive to light.  Neck:     Thyroid : No thyromegaly.  Cardiovascular:     Rate and Rhythm: Normal rate and regular rhythm.     Heart sounds: Normal heart sounds.  Pulmonary:     Effort: Pulmonary effort is normal.     Breath sounds: Normal breath sounds.   Abdominal:     General: Abdomen is flat. Bowel sounds are normal.     Palpations: Abdomen is soft. There is no mass.     Tenderness: There is no abdominal tenderness.  Musculoskeletal:        General: Normal range of motion.  Lymphadenopathy:     Cervical: No cervical adenopathy.  Skin:    General: Skin is warm and dry.     Findings: No rash.  Neurological:     General: No focal deficit present.     Mental Status: He is alert.  Psychiatric:        Mood and Affect: Mood normal.        Behavior: Behavior normal.      Assessment/Plan: Please see individual problem list.  Encounter for routine adult health examination with abnormal findings Assessment & Plan: Physical exam complete. We will check lab work as outlined. Colonoscopy is up to date. We will check PSA today in lab work. Tetanus up to date, three COVID vaccines completed, flu shot not due. Recommend shingles vaccine. Encourage dietary improvements, particularly reducing ice cream consumption, and regular exercise. Recommend scheduling an ophthalmology appointment. Continue routine dental exams. Return to care in one year, sooner as needed.    Swelling, mass, or lump on face Assessment & Plan: Subcutaneous mass is slowly growing. Further evaluation needed. Refer to Plastic Surgery for potential removal of facial mass. Advised he can also follow up with Dermatology if able to get sooner appointment for further evaluation.   Orders: -     Ambulatory referral to Plastic Surgery  Neuropathy with IgM monoclonal gammopathy (HCC) Assessment & Plan: Peripheral neuropathy stable with no acute issues. Follow up with Neurology.    MGUS (monoclonal gammopathy of unknown significance) Assessment & Plan: MGUS under surveillance with oncology. Recent visit in May, follow-up scheduled for October. No current need for bone marrow biopsy. Follow up as scheduled.   Orders: -     Testosterone   Psoriasis Assessment &  Plan: Psoriasis managed by dermatology with Roflumilast  cream 2-3 times per week. Stable. Continue. Follow up as scheduled.    OSA (obstructive sleep apnea) Assessment & Plan: Obstructive sleep apnea managed with CPAP for 12 years. Continue nightly CPAP use.    Hyperlipidemia, unspecified hyperlipidemia type Assessment & Plan: Managed with diet, exercise and lifestyle modifications. Check lipid panel.   Orders: -     Lipid panel  Prediabetes Assessment & Plan: Managed with diet, exercise and lifestyle modifications. Order A1c to monitor glucose control.  Orders: -     Hemoglobin A1c  Prostate cancer screening -     PSA     Return in about 1 year (around 06/18/2025) for Annual Exam, sooner as needed.   Leron Glance, NP-C [facility] [employer]

## 2024-06-19 LAB — PSA: Prostate Specific Ag, Serum: 0.6 ng/mL (ref 0.0–4.0)

## 2024-06-19 LAB — LIPID PANEL
Chol/HDL Ratio: 5.4 ratio — ABNORMAL HIGH (ref 0.0–5.0)
Cholesterol, Total: 209 mg/dL — ABNORMAL HIGH (ref 100–199)
HDL: 39 mg/dL — ABNORMAL LOW (ref >39)
LDL Chol Calc (NIH): 147 mg/dL — ABNORMAL HIGH (ref 0–99)
Triglycerides: 129 mg/dL (ref 0–149)
VLDL Cholesterol Cal: 23 mg/dL (ref 5–40)

## 2024-06-19 LAB — HEMOGLOBIN A1C
Est. average glucose Bld gHb Est-mCnc: 146 mg/dL
Hgb A1c MFr Bld: 6.7 % — ABNORMAL HIGH (ref 4.8–5.6)

## 2024-06-19 LAB — TESTOSTERONE: Testosterone: 266 ng/dL (ref 264–916)

## 2024-06-21 ENCOUNTER — Ambulatory Visit: Payer: Self-pay | Admitting: Nurse Practitioner

## 2024-06-22 ENCOUNTER — Other Ambulatory Visit: Payer: Self-pay | Admitting: Nurse Practitioner

## 2024-06-22 DIAGNOSIS — E119 Type 2 diabetes mellitus without complications: Secondary | ICD-10-CM

## 2024-06-22 DIAGNOSIS — R7989 Other specified abnormal findings of blood chemistry: Secondary | ICD-10-CM

## 2024-06-22 DIAGNOSIS — E785 Hyperlipidemia, unspecified: Secondary | ICD-10-CM

## 2024-06-22 MED ORDER — METFORMIN HCL ER 500 MG PO TB24: 500.0000 mg | ORAL_TABLET | Freq: Two times a day (BID) | ORAL | 3 refills | Status: AC

## 2024-06-22 MED ORDER — ROSUVASTATIN CALCIUM 10 MG PO TABS: 10.0000 mg | ORAL_TABLET | Freq: Every day | ORAL | 3 refills | Status: AC

## 2024-07-01 ENCOUNTER — Encounter: Payer: Self-pay | Admitting: Nurse Practitioner

## 2024-07-01 DIAGNOSIS — L409 Psoriasis, unspecified: Secondary | ICD-10-CM | POA: Insufficient documentation

## 2024-07-01 NOTE — Assessment & Plan Note (Signed)
 MGUS under surveillance with oncology. Recent visit in May, follow-up scheduled for October. No current need for bone marrow biopsy. Follow up as scheduled.

## 2024-07-01 NOTE — Assessment & Plan Note (Signed)
 Physical exam complete. We will check lab work as outlined. Colonoscopy is up to date. We will check PSA today in lab work. Tetanus up to date, three COVID vaccines completed, flu shot not due. Recommend shingles vaccine. Encourage dietary improvements, particularly reducing ice cream consumption, and regular exercise. Recommend scheduling an ophthalmology appointment. Continue routine dental exams. Return to care in one year, sooner as needed.

## 2024-07-01 NOTE — Assessment & Plan Note (Signed)
 Managed with diet, exercise and lifestyle modifications. Check lipid panel.

## 2024-07-01 NOTE — Assessment & Plan Note (Signed)
 Subcutaneous mass is slowly growing. Further evaluation needed. Refer to Plastic Surgery for potential removal of facial mass. Advised he can also follow up with Dermatology if able to get sooner appointment for further evaluation.

## 2024-07-01 NOTE — Assessment & Plan Note (Signed)
 Obstructive sleep apnea managed with CPAP for 12 years. Continue nightly CPAP use.

## 2024-07-01 NOTE — Assessment & Plan Note (Signed)
 Peripheral neuropathy stable with no acute issues. Follow up with Neurology.

## 2024-07-01 NOTE — Assessment & Plan Note (Addendum)
 Managed with diet, exercise and lifestyle modifications. Order A1c to monitor glucose control.

## 2024-07-01 NOTE — Assessment & Plan Note (Signed)
 Psoriasis managed by dermatology with Roflumilast  cream 2-3 times per week. Stable. Continue. Follow up as scheduled.

## 2024-07-09 ENCOUNTER — Ambulatory Visit (INDEPENDENT_AMBULATORY_CARE_PROVIDER_SITE_OTHER): Payer: PRIVATE HEALTH INSURANCE

## 2024-07-09 VITALS — BP 136/74 | HR 81 | Ht 73.0 in | Wt 212.0 lb

## 2024-07-09 DIAGNOSIS — R221 Localized swelling, mass and lump, neck: Secondary | ICD-10-CM

## 2024-07-09 DIAGNOSIS — R220 Localized swelling, mass and lump, head: Secondary | ICD-10-CM | POA: Diagnosis not present

## 2024-07-09 NOTE — Progress Notes (Addendum)
 NAME: Henry Garrett  MRN: [id number]  DOB: [birth year]   Referring physician: Gretel App, NP  PCP: Gretel App, NP  CHIEF COMPLAINT: Soft tissue mass of the left cheek and right occipital area x2  HPI:  Henry Garrett is a 58 y.o. year old male who presents with 3 soft tissue masses, one on the left cheek and two right occipital area x2. They are non painful, soft and has enlarged over the last few years slowly. No episodes of infection noted. Had a history of SCC of the left cheek treated with 5-FU. Former smoker (Quit 2001).  PMH:  Past Medical History:  Diagnosis Date   Sleep apnea    CPAP 12-20cmH2O   Squamous cell carcinoma of skin of face 02/2019   left cheek. Tx with topical 5FU/Calcipotriene  Type II DM  PSH:  Past Surgical History:  Procedure Laterality Date   COLONOSCOPY WITH PROPOFOL  N/A 07/15/2022   Procedure: COLONOSCOPY WITH PROPOFOL ;  Surgeon: Therisa Bi, MD;  Location: [facility] ENDOSCOPY;  Service: Gastroenterology;  Laterality: N/A;   HERNIA REPAIR     infant   TONSILECTOMY, ADENOIDECTOMY, BILATERAL MYRINGOTOMY AND TUBES       MEDICATIONS:   Current Outpatient Medications:    metFORMIN  (GLUCOPHAGE -XR) 500 MG 24 hr tablet, Take 1 tablet (500 mg total) by mouth 2 (two) times daily with a meal., Disp: 180 tablet, Rfl: 3   primidone (MYSOLINE) 50 MG tablet, Take 1 tablet by mouth 2 (two) times daily., Disp: , Rfl:    Roflumilast  (ZORYVE ) 0.3 % CREA, qd to hands prn flares, Disp: 60 g, Rfl: 3   rosuvastatin  (CRESTOR ) 10 MG tablet, Take 1 tablet (10 mg total) by mouth daily., Disp: 90 tablet, Rfl: 3   ALLERGIES:  has no known allergies.   FAMILY HISTORY:  Family History  Problem Relation Age of Onset   Heart disease Brother        heart valve replacement, [place]    Arthritis Mother        s/p bilateral knee replacement   Lung cancer Father        suspcted lung cancer/ Hx of smoking   VITALS:  Vitals:   07/09/24 1437  BP: 136/74   Pulse: 81  SpO2: 94%    Constitutional: Good color, good hydration. VSS. Head and Neck: No lymphadenopathy, thyromegaly or masses  Chest: Normal breathing, Normal shape and excursion.  Subcutaneous mass measures 2 cm x 2 cm on left cheek, Subcutaneous mass measures 2 cm x 2 cm on right occipital scalp and 1 cm x 1.5 cm on right neck. Currently not infected. All mobile and not attached to underlying structures. No basin lymphadenopathy, satellite lesions or in-transit lesions.  ASSESSMENT/PLAN  Assessment & Plan   Pt. presents with a subcutaneous masses most representative of benign masses, yet with history of SCC can not rule out malignancy until this masses are sent to pathology. Today we discussed the risks, benefits and alternatives to mass excision and possible tissue rearrangement. We discussed the alternatives which include continued observation; however, I told the patient that I do not believe this mass will resolve on its own. We then discussed the benefits of surgical excision which include complete removal of the lesion. We discussed the risks of excision which include seroma, hematoma, infection, bleeding, damage to surrounding healthy tissue, facial asymmetry due to injury to facial nerves, damage to surrounding structures and need for further surgery. We also discussed the risks of hair loss, wound separation and  recurrence of the mass. We discussed scar patterns of tissue rearrangement, if needed.  I explained that the mass will be sent to pathology and if it were to be malignant further surgery may be needed. We discussed the risks of anesthesia which will be further elaborated on by our anesthesia colleagues. The patient has a good understanding of all the risks and benefits, postoperative course and care. We obtained pictures. All questions were answered.  Recommend excision of all 3 lesions in the operating room. Risks, benefits and limitations were discussed. Procedure will be  precertified and scheduled.  Amelya Mabry M. Zamari Bonsall, MD [facility]

## 2024-07-27 ENCOUNTER — Encounter: Payer: Self-pay | Admitting: Urology

## 2024-07-27 ENCOUNTER — Ambulatory Visit (INDEPENDENT_AMBULATORY_CARE_PROVIDER_SITE_OTHER): Payer: PRIVATE HEALTH INSURANCE | Admitting: Urology

## 2024-07-27 VITALS — BP 160/87 | HR 91 | Ht 73.0 in | Wt 215.0 lb

## 2024-07-27 DIAGNOSIS — R7989 Other specified abnormal findings of blood chemistry: Secondary | ICD-10-CM

## 2024-07-27 NOTE — Progress Notes (Signed)
 07/27/2024 1:32 PM   Henry Garrett 1966-04-16 [id number]  Referring provider: Gretel App, NP [address]  [place] [address]  Chief Complaint  Patient presents with   Hypogonadism    HPI: BRAIDON CHERMAK is a 58 y.o. male referred for evaluation of hypogonadism  Had noticed loss of muscle mass in the legs over the last 1-2 years and had a recent office visit with his PCP requested a testosterone  level which was low at 266 ng/dL Does have tiredness, fatigue and mild ED.  Libido is okay Past urologic history remarkable for vasectomy ~20 years ago PSA 06/19/2024 0.6   PMH: Past Medical History:  Diagnosis Date   Sleep apnea    CPAP 12-20cmH2O   Squamous cell carcinoma of skin of face 02/2019   left cheek. Tx with topical 5FU/Calcipotriene    Surgical History: Past Surgical History:  Procedure Laterality Date   COLONOSCOPY WITH PROPOFOL  N/A 07/15/2022   Procedure: COLONOSCOPY WITH PROPOFOL ;  Surgeon: Therisa Bi, MD;  Location: [facility] ENDOSCOPY;  Service: Gastroenterology;  Laterality: N/A;   HERNIA REPAIR     infant   TONSILECTOMY, ADENOIDECTOMY, BILATERAL MYRINGOTOMY AND TUBES      Home Medications:  Allergies as of 07/27/2024   No Known Allergies      Medication List        Accurate as of July 27, 2024  1:32 PM. If you have any questions, ask your nurse or doctor.          metFORMIN  500 MG 24 hr tablet Commonly known as: GLUCOPHAGE -XR Take 1 tablet (500 mg total) by mouth 2 (two) times daily with a meal.   primidone 50 MG tablet Commonly known as: MYSOLINE Take 1 tablet by mouth 2 (two) times daily.   rosuvastatin  10 MG tablet Commonly known as: Crestor  Take 1 tablet (10 mg total) by mouth daily.   Zoryve  0.3 % Crea Generic drug: Roflumilast  qd to hands prn flares        Allergies: No Known Allergies  Family History: Family History  Problem Relation Age of Onset   Heart disease Brother        heart  valve replacement, [place]    Arthritis Mother        s/p bilateral knee replacement   Lung cancer Father        suspcted lung cancer/ Hx of smoking    Social History:  reports that he quit smoking about 24 years ago. His smoking use included cigarettes. He started smoking about 39 years ago. He has a 15 pack-year smoking history. He has never used smokeless tobacco. He reports current alcohol use of about 20.0 standard drinks of alcohol per week. He reports that he does not use drugs.   Physical Exam: BP (!) 160/87   Pulse 91   Ht 6' 1 (1.854 m)   Wt 215 lb (97.5 kg)   BMI 28.37 kg/m   Constitutional:  Alert, No acute distress. HEENT: [place] AT Respiratory: Normal respiratory effort, no increased work of breathing. GU: Without lesions.  Testes descended bilaterally without masses or tenderness.  Estimated testis volume 20 cc bilaterally Psychiatric: Normal mood and affect.   Assessment & Plan:    1.  Hypogonadism We discussed the diagnosis of testosterone  is based on 2 abnormal total testosterone  levels drawn in the a.m. admitted with signs and symptoms of low testosterone . Follow-up a.m. testosterone  level and LH level were ordered We discussed various forms of testosterone  placement including  topical preparations, intramuscular injections, subcutaneous injections, subcutaneous pellet implantation and oral testosterone .  Pros and cons of each form were discussed.  We discussed testosterone  replacement therapy including the following: Treatment may result in improvements in erectile function, low sex drive, anemia, bone mineral density, lean body mass, and depressive symptoms; evidence is inconclusive whether testosterone  therapy improves cognitive function, measures of diabetes, energy, fatigue, lipid profiles, and quality of life measures; there is no conclusive evidence linking testosterone  therapy to the development of prostate cancer; there is no definitive evidence linking testosterone   therapy to a higher incidence of venothrombolic events; at the present time it cannot be stated definitively whether testosterone  therapy increases or decreases the risk of cardiovascular events including myocardial infarction and stroke. Potential side effects were discussed including erythrocytosis, gynecomastia.  The need for regular monitoring of testosterone  levels and hematocrit was discussed. Follow-up testosterone  level and LH scheduled.  In the meantime he will think over TRT options   Glendia JAYSON Barba, MD  [facility] [address] [place], [place] [address] [phone]

## 2024-08-03 ENCOUNTER — Telehealth: Payer: Self-pay

## 2024-08-03 NOTE — Telephone Encounter (Signed)
 Patient returned Heathers call to sch surgery

## 2024-08-09 ENCOUNTER — Other Ambulatory Visit: Payer: PRIVATE HEALTH INSURANCE

## 2024-08-09 ENCOUNTER — Other Ambulatory Visit (INDEPENDENT_AMBULATORY_CARE_PROVIDER_SITE_OTHER): Payer: PRIVATE HEALTH INSURANCE

## 2024-08-09 DIAGNOSIS — E785 Hyperlipidemia, unspecified: Secondary | ICD-10-CM | POA: Diagnosis not present

## 2024-08-09 DIAGNOSIS — R7989 Other specified abnormal findings of blood chemistry: Secondary | ICD-10-CM

## 2024-08-09 LAB — LIPID PANEL
Cholesterol: 110 mg/dL (ref 0–200)
HDL: 38.8 mg/dL — ABNORMAL LOW (ref >39.00)
LDL Cholesterol: 61 mg/dL (ref 0–99)
NonHDL: 70.93
Total CHOL/HDL Ratio: 3
Triglycerides: 52 mg/dL (ref 0.0–149.0)
VLDL: 10.4 mg/dL (ref 0.0–40.0)

## 2024-08-09 LAB — HEPATIC FUNCTION PANEL
ALT: 24 U/L (ref 0–53)
AST: 15 U/L (ref 0–37)
Albumin: 4.6 g/dL (ref 3.5–5.2)
Alkaline Phosphatase: 64 U/L (ref 39–117)
Bilirubin, Direct: 0 mg/dL (ref 0.0–0.3)
Total Bilirubin: 0.5 mg/dL (ref 0.2–1.2)
Total Protein: 7.2 g/dL (ref 6.0–8.3)

## 2024-08-10 ENCOUNTER — Ambulatory Visit: Payer: Self-pay | Admitting: Nurse Practitioner

## 2024-08-10 ENCOUNTER — Ambulatory Visit: Payer: Self-pay | Admitting: Urology

## 2024-08-10 LAB — TESTOSTERONE: Testosterone: 322 ng/dL (ref 264–916)

## 2024-08-10 LAB — LUTEINIZING HORMONE: LH: 3.1 m[IU]/mL (ref 1.7–8.6)

## 2024-08-24 ENCOUNTER — Encounter: Payer: PRIVATE HEALTH INSURANCE | Admitting: Surgical

## 2024-09-13 ENCOUNTER — Encounter (HOSPITAL_BASED_OUTPATIENT_CLINIC_OR_DEPARTMENT_OTHER): Payer: Self-pay

## 2024-09-13 ENCOUNTER — Ambulatory Visit (HOSPITAL_BASED_OUTPATIENT_CLINIC_OR_DEPARTMENT_OTHER): Admit: 2024-09-13 | Payer: PRIVATE HEALTH INSURANCE

## 2024-09-13 SURGERY — EXCISION, MASS, HEAD
Anesthesia: Choice

## 2024-09-21 ENCOUNTER — Ambulatory Visit: Payer: PRIVATE HEALTH INSURANCE | Admitting: Nurse Practitioner

## 2024-09-21 ENCOUNTER — Encounter: Payer: Self-pay | Admitting: Nurse Practitioner

## 2024-09-21 VITALS — BP 140/86 | HR 85 | Temp 98.2°F | Ht 73.0 in | Wt 211.8 lb

## 2024-09-21 DIAGNOSIS — E785 Hyperlipidemia, unspecified: Secondary | ICD-10-CM

## 2024-09-21 DIAGNOSIS — E119 Type 2 diabetes mellitus without complications: Secondary | ICD-10-CM | POA: Insufficient documentation

## 2024-09-21 DIAGNOSIS — I10 Essential (primary) hypertension: Secondary | ICD-10-CM

## 2024-09-21 DIAGNOSIS — D472 Monoclonal gammopathy: Secondary | ICD-10-CM

## 2024-09-21 LAB — POCT GLYCOSYLATED HEMOGLOBIN (HGB A1C)
HbA1c POC (<> result, manual entry): 6.1 % (ref 4.0–5.6)
HbA1c, POC (controlled diabetic range): 6.1 % (ref 0.0–7.0)
HbA1c, POC (prediabetic range): 6.1 % (ref 5.7–6.4)
Hemoglobin A1C: 6.1 % — AB (ref 4.0–5.6)

## 2024-09-21 NOTE — Progress Notes (Signed)
 Henry Glance, NP-C Phone: [phone]  Henry Garrett is a 58 y.o. male who presents today for follow up.   Discussed the use of AI scribe software for clinical note transcription with the patient, who gave verbal consent to proceed.  History of Present Illness   Henry Garrett is a 58 year old male with low testosterone  and diabetes who presents for a follow-up visit.  He has ongoing issues with low testosterone  levels, which are borderline low. Insurance requires two low readings to approve testosterone  replacement therapy. Different labs have varying reference ranges, with some considering 300 as low. He is awaiting a repeat test in February.  He recently consulted with a plastic surgeon for a procedure that was canceled due to the surgeon being out of network, which he discovered after scheduling. He plans to follow up with dermatology after the new year to explore options.  He has a blood condition that may cause nerve degeneration and has been referred to neurology. He reports that both his oncologist and neurologist agreed it was a good time to try rituximab infusion, but this was denied by insurance. He is frustrated with the insurance process.  He started on Crestor  for high cholesterol, with an initial LDL of 147. After starting the medication, his LDL dropped to 61. He reports no issues with Crestor , such as muscle pain or fatigue.  He was diagnosed with diabetes after his A1c was slightly over the threshold. He started on metformin  extended release, taking it twice a day, and reports no gastrointestinal side effects. No symptoms of excessive thirst, urination, or low blood sugar episodes. He mentions having a tremor but does not associate it with low blood sugar. He is not currently checking his blood sugar levels at home.      Social History   Tobacco Use  Smoking Status Former   Current packs/day: 0.00   Average packs/day: 1 pack/day for 15.0 years  (15.0 ttl pk-yrs)   Types: Cigarettes   Start date: 03/17/1985   Quit date: 03/17/2000   Years since quitting: 24.5  Smokeless Tobacco Never    Current Outpatient Medications on File Prior to Visit  Medication Sig Dispense Refill   metFORMIN  (GLUCOPHAGE -XR) 500 MG 24 hr tablet Take 1 tablet (500 mg total) by mouth 2 (two) times daily with a meal. 180 tablet 3   primidone (MYSOLINE) 250 MG tablet Take 0.5 tablets by mouth in the morning and at bedtime.     Roflumilast  (ZORYVE ) 0.3 % CREA qd to hands prn flares 60 g 3   rosuvastatin  (CRESTOR ) 10 MG tablet Take 1 tablet (10 mg total) by mouth daily. 90 tablet 3   No current facility-administered medications on file prior to visit.     ROS see history of present illness  Objective  Physical Exam Vitals:   09/21/24 1524 09/21/24 1556  BP: (!) 144/80 (!) 140/86  Pulse: 85   Temp: 98.2 F (36.8 C)   SpO2: 98%     BP Readings from Last 3 Encounters:  09/21/24 (!) 140/86  07/27/24 (!) 160/87  07/09/24 136/74   Wt Readings from Last 3 Encounters:  09/21/24 211 lb 12.8 oz (96.1 kg)  07/27/24 215 lb (97.5 kg)  07/09/24 212 lb (96.2 kg)    Physical Exam Constitutional:      General: He is not in acute distress.    Appearance: Normal appearance.  HENT:     Head: Normocephalic.  Cardiovascular:     Rate and  Rhythm: Normal rate and regular rhythm.     Heart sounds: Normal heart sounds.  Pulmonary:     Effort: Pulmonary effort is normal.     Breath sounds: Normal breath sounds.  Skin:    General: Skin is warm and dry.  Neurological:     General: No focal deficit present.     Mental Status: He is alert.  Psychiatric:        Mood and Affect: Mood normal.        Behavior: Behavior normal.      Assessment/Plan: Please see individual problem list.  Type 2 diabetes mellitus without complication, without long-term current use of insulin (HCC) Assessment & Plan: A1c was previously 6.7 and is currently managed with  metformin . No hypoglycemia symptoms. A1c goal is to maintain below 7. Checked A1c via finger stick today, improvement to 6.1. Continue metformin  XR 500 mg twice daily. Encourage healthy diet and regular exercise.   Orders: -     POCT glycosylated hemoglobin (Hb A1C)  Essential hypertension, benign Assessment & Plan: Blood pressure elevated x 2 readings today. Previously adequately controlled without medications. Encourage home monitoring and decreased sodium intake. We will continue to monitor, if remaining elevated consider starting antihypertensive at next appointment.    Hyperlipidemia, unspecified hyperlipidemia type Assessment & Plan: LDL reduced from 147 mg/dL to 61 mg/dL with Crestor . No side effects reported. Continue Crestor  as prescribed.   MGUS (monoclonal gammopathy of unknown significance) Assessment & Plan: MGUS under surveillance with oncology. Recent visit in October. No current need for bone marrow biopsy. Follow up as scheduled.    Swelling, mass, or lump on face Assessment & Plan: Surgery for removal recently canceled due to plastic surgeon being out of network with patient's insurance. He is going to contact his insurance to see who is in network and follow up with his dermatologist.       Return in about 6 months (around 03/21/2025) for Follow up.   Henry Glance, NP-C [facility]

## 2024-10-08 ENCOUNTER — Encounter: Payer: PRIVATE HEALTH INSURANCE | Admitting: Surgical

## 2024-10-11 ENCOUNTER — Encounter: Payer: Self-pay | Admitting: Nurse Practitioner

## 2024-10-11 NOTE — Assessment & Plan Note (Signed)
 LDL reduced from 147 mg/dL to 61 mg/dL with Crestor . No side effects reported. Continue Crestor  as prescribed.

## 2024-10-11 NOTE — Assessment & Plan Note (Signed)
 MGUS under surveillance with oncology. Recent visit in October. No current need for bone marrow biopsy. Follow up as scheduled.

## 2024-10-11 NOTE — Assessment & Plan Note (Signed)
 A1c was previously 6.7 and is currently managed with metformin . No hypoglycemia symptoms. A1c goal is to maintain below 7. Checked A1c via finger stick today, improvement to 6.1. Continue metformin  XR 500 mg twice daily. Encourage healthy diet and regular exercise.

## 2024-10-11 NOTE — Assessment & Plan Note (Addendum)
 Surgery for removal recently canceled due to plastic surgeon being out of network with patient's insurance. He is going to contact his insurance to see who is in network and follow up with his dermatologist.

## 2024-10-11 NOTE — Assessment & Plan Note (Signed)
 Blood pressure elevated x 2 readings today. Previously adequately controlled without medications. Encourage home monitoring and decreased sodium intake. We will continue to monitor, if remaining elevated consider starting antihypertensive at next appointment.

## 2025-02-15 ENCOUNTER — Ambulatory Visit: Payer: PRIVATE HEALTH INSURANCE | Admitting: Dermatology

## 2025-03-29 ENCOUNTER — Ambulatory Visit: Payer: PRIVATE HEALTH INSURANCE | Admitting: Nurse Practitioner

## 2025-06-24 ENCOUNTER — Encounter: Payer: PRIVATE HEALTH INSURANCE | Admitting: Nurse Practitioner
